# Patient Record
Sex: Male | Born: 1980 | Race: White | Hispanic: No | Marital: Married | Smoking: Never smoker
Health system: Southern US, Community
[De-identification: ages and names within clinical notes are randomized; demographics above are authoritative.]

---

## 2012-03-13 ENCOUNTER — Encounter: Payer: Self-pay | Admitting: Internal Medicine

## 2012-03-13 ENCOUNTER — Ambulatory Visit (INDEPENDENT_AMBULATORY_CARE_PROVIDER_SITE_OTHER): Admitting: Internal Medicine

## 2012-03-13 VITALS — BP 122/90 | HR 64 | Ht 74.0 in | Wt 213.8 lb

## 2012-03-13 DIAGNOSIS — J302 Other seasonal allergic rhinitis: Secondary | ICD-10-CM

## 2012-03-13 DIAGNOSIS — G4733 Obstructive sleep apnea (adult) (pediatric): Secondary | ICD-10-CM

## 2012-03-13 DIAGNOSIS — J309 Allergic rhinitis, unspecified: Secondary | ICD-10-CM

## 2012-03-13 NOTE — Progress Notes (Signed)
03/13/12- 31 yoM never smoker self referral-snoring and day time sleepiness; has woken up gasping for air at times. Wife complains of his snoring, without noticing apnea.  Bedtime 11 PM to midnight very short sleep latency. Wakes 2-3 times during the night before up at 5:30. He admits this is not enough sleep. Snoring is worse supine but he gets backache if he sleeps on his stomach. Failed nasal strips. He gained weight 10 years ago and his wife tells him that is when snoring began. Past history of seasonal allergic rhinitis which has been less important in recent years. No history of thyroid problems. ENT surgery limited to tonsils.  Prior to Admission medications   Not on File   History reviewed. No pertinent past medical history. History reviewed. No pertinent past surgical history.. Family History  Problem Relation Age of Onset  . Breast cancer Maternal Grandmother    History   Social History  . Marital Status: Married    Spouse Name: N/A    Number of Children: N/A  . Years of Education: N/A   Occupational History  . Healthcare Admin    Social History Main Topics  . Smoking status: Never Smoker   . Smokeless tobacco: Not on file  . Alcohol Use: Yes     2-3 weekly  . Drug Use: No  . Sexually Active: Not on file   Other Topics Concern  . Not on file   Social History Narrative  . No narrative on file   ROS-see HPI Constitutional:   No-   weight loss, night sweats, fevers, chills, +fatigue, lassitude. HEENT:   No-  headaches, difficulty swallowing, tooth/dental problems, sore throat,       + Some  sneezing, itching, ear ache, nasal congestion, post nasal drip,  CV:  No-   chest pain, orthopnea, PND, swelling in lower extremities, anasarca, dizziness, palpitations Resp: No-   shortness of breath with exertion or at rest.              No-   productive cough,  No non-productive cough,  No- coughing up of blood.              No-   change in color of mucus.  No- wheezing.     Skin: No-   rash or lesions. GI:  No-   heartburn, indigestion, abdominal pain, nausea, vomiting, diarrhea,                 change in bowel habits, loss of appetite GU: No-   dysuria, change in color of urine, no urgency or frequency.  No- flank pain. MS:  No-   joint pain or swelling.  No- decreased range of motion.  No- back pain. Neuro-     nothing unusual Psych:  No- change in mood or affect. No depression or anxiety.  No memory loss.  OBJ- Physical Exam General- Alert, Oriented, Affect-appropriate, Distress- none acute, tall, mildly overweight Skin- rash-none, lesions- none, excoriation- none Lymphadenopathy- none Head- atraumatic            Eyes- Gross vision intact, PERRLA, conjunctivae and secretions clear            Ears- Hearing, canals-normal            Nose- turbinate edema, no-Septal dev, mucus, polyps, erosion, perforation             Throat- Mallampati II , mucosa clear , drainage- none, tonsils- atrophic Neck- flexible , trachea midline, no stridor ,  thyroid nl, carotid no bruit Chest - symmetrical excursion , unlabored           Heart/CV- RRR , no murmur , no gallop  , no rub, nl s1 s2                           - JVD- none , edema- none, stasis changes- none, varices- none           Lung- clear to P&A, wheeze- none, cough- none , dullness-none, rub- none           Chest wall-  Abd- tender-no, distended-no, bowel sounds-present, HSM- no Br/ Gen/ Rectal- Not done, not indicated Extrem- cyanosis- none, clubbing, none, atrophy- none, strength- nl Neuro- grossly intact to observation

## 2012-03-13 NOTE — Patient Instructions (Addendum)
Order- [facility]  [facility] Unattended Home NPSG    Dx  OSA  Sample Nasonex nasal spray- 2 puffs each nostril once daily at bedtime.

## 2012-03-17 DIAGNOSIS — G4733 Obstructive sleep apnea (adult) (pediatric): Secondary | ICD-10-CM | POA: Insufficient documentation

## 2012-03-17 DIAGNOSIS — J302 Other seasonal allergic rhinitis: Secondary | ICD-10-CM | POA: Insufficient documentation

## 2012-03-17 NOTE — Assessment & Plan Note (Signed)
He qualifies for home sleep study so we will schedule that. We have discussed sleep hygiene, medical significance of sleep apnea if present, and the nature of a home, unattended sleep study

## 2012-03-17 NOTE — Assessment & Plan Note (Signed)
Still important enough to affect his sleep some in the springtime. Plan-sample of Nasonex for trial

## 2012-07-11 ENCOUNTER — Ambulatory Visit (INDEPENDENT_AMBULATORY_CARE_PROVIDER_SITE_OTHER): Admitting: Sports Medicine

## 2012-07-11 ENCOUNTER — Encounter: Payer: Self-pay | Admitting: Sports Medicine

## 2012-07-11 VITALS — BP 119/80 | HR 75 | Wt 197.0 lb

## 2012-07-11 DIAGNOSIS — J309 Allergic rhinitis, unspecified: Secondary | ICD-10-CM

## 2012-07-11 DIAGNOSIS — Z299 Encounter for prophylactic measures, unspecified: Secondary | ICD-10-CM

## 2012-07-11 DIAGNOSIS — R7402 Elevation of levels of lactic acid dehydrogenase (LDH): Secondary | ICD-10-CM

## 2012-07-11 DIAGNOSIS — J302 Other seasonal allergic rhinitis: Secondary | ICD-10-CM

## 2012-07-11 DIAGNOSIS — Z0000 Encounter for general adult medical examination without abnormal findings: Secondary | ICD-10-CM | POA: Insufficient documentation

## 2012-07-11 DIAGNOSIS — R7401 Elevation of levels of liver transaminase levels: Secondary | ICD-10-CM

## 2012-07-11 NOTE — Assessment & Plan Note (Addendum)
This is a healthy young male without any medical problems. Screening lipids, and a metabolic panel. He is up-to-date on all vaccinations, we'll be getting his flu shot next week. He can come back to see me in one year or sooner if needed.  Patient was counseled, and risk factors discussed.

## 2012-07-11 NOTE — Assessment & Plan Note (Signed)
Well-controlled with an occasional Claritin.

## 2012-07-11 NOTE — Progress Notes (Signed)
Subjective:    CC: Establish care.   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 He has no medical problems, and no complaints.    Allergic rhinitis: He does have occasional seasonal allergic rhinitis, as well controlled with occasional Claritin.   Snoring: He tends to snore at night, and has discussed this with the pulmonologist, sleep apnea is not suspected..  Past medical history, Surgical history, Family history, Social history, Allergies, and medications have been entered into the medical record, reviewed, and no changes needed.   Review of Systems: No headache, visual changes, nausea, vomiting, diarrhea, constipation, dizziness, abdominal pain, skin rash, fevers, chills, night sweats, swollen lymph nodes, weight loss, chest pain, body aches, joint swelling, muscle aches, or shortness of breath.   Objective:    General: Well Developed, well nourished, and in no acute distress.  Neuro: Alert and oriented x3, extra-ocular muscles intact.  HEENT: Normocephalic, atraumatic, pupils equal round reactive to light, neck supple, no masses, no lymphadenopathy, thyroid nonpalpable.  Skin: Warm and dry, no rashes noted.  Cardiac: Regular rate and rhythm, no murmurs rubs or gallops.  Respiratory: Clear to auscultation bilaterally. Not using accessory muscles, speaking in full sentences.  Abdominal: Soft, nontender, nondistended, positive bowel sounds, no masses, no organomegaly.  Musculoskeletal: Shoulder, elbow, wrist, hip, knee, ankle stable, and with full range of motion.   Impression and Recommendations:

## 2012-07-21 LAB — COMPREHENSIVE METABOLIC PANEL
AST: 43 U/L — ABNORMAL HIGH (ref 0–37)
Albumin: 4.5 g/dL (ref 3.5–5.2)
Alkaline Phosphatase: 52 U/L (ref 39–117)
BUN: 16 mg/dL (ref 6–23)
Potassium: 4.5 mEq/L (ref 3.5–5.3)
Sodium: 144 mEq/L (ref 135–145)
Total Bilirubin: 0.6 mg/dL (ref 0.3–1.2)

## 2012-07-21 LAB — LIPID PANEL
Cholesterol: 149 mg/dL (ref 0–200)
HDL: 37 mg/dL — ABNORMAL LOW (ref >39)
LDL Cholesterol: 101 mg/dL — ABNORMAL HIGH (ref 0–99)
Total CHOL/HDL Ratio: 4 Ratio
Triglycerides: 55 mg/dL (ref <150)
VLDL: 11 mg/dL (ref 0–40)

## 2012-07-21 LAB — COMPREHENSIVE METABOLIC PANEL WITH GFR
ALT: 38 U/L (ref 0–53)
CO2: 27 meq/L (ref 19–32)
Calcium: 9.5 mg/dL (ref 8.4–10.5)
Chloride: 106 meq/L (ref 96–112)
Creat: 0.98 mg/dL (ref 0.50–1.35)
Glucose, Bld: 76 mg/dL (ref 70–99)
Total Protein: 6.9 g/dL (ref 6.0–8.3)

## 2012-07-22 NOTE — Addendum Note (Signed)
Addended by: Monica Becton on: 07/22/2012 09:55 AM   Modules accepted: Orders

## 2012-07-23 ENCOUNTER — Other Ambulatory Visit: Payer: Self-pay | Admitting: *Deleted

## 2012-08-03 LAB — HEPATITIS PANEL, ACUTE
HCV Ab: NEGATIVE
Hep A IgM: NEGATIVE
Hep B C IgM: NEGATIVE
Hepatitis B Surface Ag: NEGATIVE

## 2014-04-21 ENCOUNTER — Encounter: Payer: Self-pay | Admitting: Sports Medicine

## 2014-04-21 ENCOUNTER — Ambulatory Visit (INDEPENDENT_AMBULATORY_CARE_PROVIDER_SITE_OTHER): Admitting: Sports Medicine

## 2014-04-21 VITALS — BP 124/81 | HR 65 | Ht 74.0 in | Wt 200.0 lb

## 2014-04-21 DIAGNOSIS — Z299 Encounter for prophylactic measures, unspecified: Secondary | ICD-10-CM

## 2014-04-21 DIAGNOSIS — Z0000 Encounter for general adult medical examination without abnormal findings: Secondary | ICD-10-CM

## 2014-04-21 LAB — CBC
HCT: 44.1 % (ref 39.0–52.0)
Hemoglobin: 15.6 g/dL (ref 13.0–17.0)
MCH: 29.9 pg (ref 26.0–34.0)
MCHC: 35.4 g/dL (ref 30.0–36.0)
MCV: 84.6 fL (ref 78.0–100.0)
Platelets: 254 10*3/uL (ref 150–400)
RBC: 5.21 MIL/uL (ref 4.22–5.81)
RDW: 14 % (ref 11.5–15.5)
WBC: 6.8 10*3/uL (ref 4.0–10.5)

## 2014-04-21 LAB — LIPID PANEL
Cholesterol: 157 mg/dL (ref 0–200)
HDL: 50 mg/dL (ref >39)
LDL Cholesterol: 94 mg/dL (ref 0–99)
Total CHOL/HDL Ratio: 3.1 ratio
Triglycerides: 63 mg/dL (ref <150)
VLDL: 13 mg/dL (ref 0–40)

## 2014-04-21 LAB — COMPREHENSIVE METABOLIC PANEL WITH GFR
AST: 17 U/L (ref 0–37)
Alkaline Phosphatase: 53 U/L (ref 39–117)
BUN: 13 mg/dL (ref 6–23)
Creat: 0.99 mg/dL (ref 0.50–1.35)

## 2014-04-21 LAB — COMPREHENSIVE METABOLIC PANEL
ALT: 27 U/L (ref 0–53)
Albumin: 4.7 g/dL (ref 3.5–5.2)
CO2: 28 mEq/L (ref 19–32)
Calcium: 9.7 mg/dL (ref 8.4–10.5)
Chloride: 103 mEq/L (ref 96–112)
Glucose, Bld: 86 mg/dL (ref 70–99)
Potassium: 4.8 mEq/L (ref 3.5–5.3)
Sodium: 140 mEq/L (ref 135–145)
Total Bilirubin: 0.8 mg/dL (ref 0.2–1.2)
Total Protein: 7.1 g/dL (ref 6.0–8.3)

## 2014-04-21 NOTE — Progress Notes (Signed)
  Subjective:    CC: Establish care.   HPI:  Preventive measure: This pleasant 33 year old male is here for a complete physical. No questions, no complaints.  Past medical history, Surgical history, Family history not pertinant except as noted below, Social history, Allergies, and medications have been entered into the medical record, reviewed, and no changes needed.   Review of Systems: No headache, visual changes, nausea, vomiting, diarrhea, constipation, dizziness, abdominal pain, skin rash, fevers, chills, night sweats, swollen lymph nodes, weight loss, chest pain, body aches, joint swelling, muscle aches, shortness of breath, mood changes, visual or auditory hallucinations.  Objective:    General: Well Developed, well nourished, and in no acute distress.  Neuro: Alert and oriented x3, extra-ocular muscles intact, sensation grossly intact. Cranial nerves II through XII are intact, motor, sensory, and coordinative functions are all intact. HEENT: Normocephalic, atraumatic, pupils equal round reactive to light, neck supple, no masses, no lymphadenopathy, thyroid nonpalpable. Oropharynx, nasopharynx, external ear canals are unremarkable. Skin: Warm and dry, no rashes noted.  Cardiac: Regular rate and rhythm, no murmurs rubs or gallops.  Respiratory: Clear to auscultation bilaterally. Not using accessory muscles, speaking in full sentences.  Abdominal: Soft, nontender, nondistended, positive bowel sounds, no masses, no organomegaly.  Musculoskeletal: Shoulder, elbow, wrist, hip, knee, ankle stable, and with full range of motion.  Impression and Recommendations:    The patient was counselled, risk factors were discussed, anticipatory guidance given.

## 2014-04-21 NOTE — Assessment & Plan Note (Signed)
Complete physical as above. Checking routine blood work. Return in one year.

## 2014-04-22 LAB — HEMOGLOBIN A1C
Hgb A1c MFr Bld: 5.4 % (ref <5.7)
Mean Plasma Glucose: 108 mg/dL (ref <117)

## 2014-04-22 LAB — TSH: TSH: 1.769 u[IU]/mL (ref 0.350–4.500)

## 2014-09-23 ENCOUNTER — Ambulatory Visit: Payer: Self-pay | Admitting: Sports Medicine

## 2014-09-23 DIAGNOSIS — Z0289 Encounter for other administrative examinations: Secondary | ICD-10-CM

## 2015-08-10 ENCOUNTER — Emergency Department (INDEPENDENT_AMBULATORY_CARE_PROVIDER_SITE_OTHER)
Admission: EM | Admit: 2015-08-10 | Discharge: 2015-08-10 | Disposition: A | Discharge status: Home / Self Care | Source: Home / Self Care | Attending: Family Medicine | Admitting: Family Medicine

## 2015-08-10 ENCOUNTER — Encounter: Payer: Self-pay | Admitting: *Deleted

## 2015-08-10 ENCOUNTER — Emergency Department (INDEPENDENT_AMBULATORY_CARE_PROVIDER_SITE_OTHER)

## 2015-08-10 DIAGNOSIS — R05 Cough: Secondary | ICD-10-CM | POA: Diagnosis not present

## 2015-08-10 DIAGNOSIS — R053 Chronic cough: Secondary | ICD-10-CM

## 2015-08-10 MED ORDER — AZITHROMYCIN 250 MG PO TABS
ORAL_TABLET | ORAL | Status: DC
Start: 2015-08-10 — End: 2016-02-16

## 2015-08-10 MED ORDER — PREDNISONE 20 MG PO TABS: 20.0000 mg | ORAL_TABLET | Freq: Two times a day (BID) | ORAL | Status: DC

## 2015-08-10 MED ORDER — BENZONATATE 200 MG PO CAPS: 200.0000 mg | ORAL_CAPSULE | Freq: Every day | ORAL | Status: DC

## 2015-08-10 NOTE — ED Notes (Signed)
Pt c/o 3 weeks of productive cough, and nasal congestion. Fever the first week, afebrile for 2 weeks. Denies pain or SOB.

## 2015-08-10 NOTE — ED Provider Notes (Signed)
CSN: [id number]     Arrival date & time 08/10/15  1100 History   First MD Initiated Contact with Patient 08/10/15 1136     Chief Complaint  Patient presents with  . Cough  . Nasal Congestion      HPI Comments: Patient complains of onset of a non-productive cough about 3 weeks ago that has persisted.  He has had some nasal congestion but no significant sore throat.  He had low grade fever during the first week that has resolved.  No pleuritic pain or shortness of breath, but he does wheeze occasionally. His Tdap is current.  The history is provided by the patient.    History reviewed. No pertinent past medical history. History reviewed. No pertinent past surgical history. Family History  Problem Relation Age of Onset  . Breast cancer Maternal Grandmother   . Cancer Maternal Grandmother   . Diabetes Maternal Grandmother   . Hypertension Maternal Grandmother   . Diabetes Mother   . Hypertension Mother   . Alcohol abuse Father   . Hyperlipidemia Father   . Hypertension Father   . Diabetes Maternal Aunt   . Alcohol abuse Maternal Grandfather   . Hypertension Maternal Grandfather   . Hyperlipidemia Paternal Grandmother   . Hypertension Paternal Grandmother   . Stroke Paternal Grandmother   . Alcohol abuse Paternal Grandfather   . Heart disease Paternal Grandfather   . Hyperlipidemia Paternal Grandfather   . Hypertension Paternal Grandfather    Social History  Substance Use Topics  . Smoking status: Never Smoker   . Smokeless tobacco: None  . Alcohol Use: Yes     Comment: 2-3 weekly    Review of Systems + sore throat, resolved + cough No pleuritic pain + wheezing No nasal congestion No post-nasal drainage No sinus pain/pressure No itchy/red eyes No earache No hemoptysis No SOB No fever/chills No nausea No vomiting No abdominal pain No diarrhea No urinary symptoms No skin rash No fatigue No myalgias No headache Used OTC meds without relief  Allergies   Review of patient's allergies indicates no known allergies.  Home Medications   Prior to Admission medications   Medication Sig Start Date End Date Taking? Authorizing Provider  azithromycin (ZITHROMAX Z-PAK) 250 MG tablet Take 2 tabs today; then begin one tab once daily for 4 more days. 08/10/15   Kandra Nicolas, MD  benzonatate (TESSALON) 200 MG capsule Take 1 capsule (200 mg total) by mouth at bedtime. Take as needed for cough 08/10/15   Kandra Nicolas, MD  predniSONE (DELTASONE) 20 MG tablet Take 1 tablet (20 mg total) by mouth 2 (two) times daily. Take with food. 08/10/15   Kandra Nicolas, MD   Meds Ordered and Administered this Visit  Medications - No data to display  BP 133/86 mmHg  Pulse 64  Temp(Src) 98.1 F (36.7 C) (Oral)  Resp 16  Wt 210 lb (95.255 kg)  SpO2 97% No data found.   Physical Exam Nursing notes and Vital Signs reviewed. Appearance:  Patient appears stated age, and in no acute distress Eyes:  Pupils are equal, round, and reactive to light and accomodation.  Extraocular movement is intact.  Conjunctivae are not inflamed  Ears:  Canals normal.  Tympanic membranes normal.  Nose:  Mildly congested turbinates.  No sinus tenderness.   Pharynx:  Normal Neck:  Supple.  Scattered tenderness of posterior nodes are palpated bilaterally  Lungs:   Right posterior chest has rhonchi and few scattered faint wheezes.  Breath sounds are equal.  Moving air well. Heart:  Regular rate and rhythm without murmurs, rubs, or gallops.  Abdomen:  Nontender without masses or hepatosplenomegaly.  Bowel sounds are present.  No CVA or flank tenderness.  Extremities:  No edema.  No calf tenderness Skin:  No rash present.   ED Course  Procedures  None   Imaging Review Dg Chest 2 View  08/10/2015  CLINICAL DATA:  Persisting cough for 3 weeks EXAM: CHEST - 2 VIEW COMPARISON:  None. FINDINGS: The heart size and mediastinal contours are within normal limits. Both lungs are clear.  The visualized skeletal structures are unremarkable. IMPRESSION: No active disease. Electronically Signed   By: Inez Catalina M.D.   On: 08/10/2015 12:21       MDM   1. Persistent cough; ?mycoplasma    Begin Z-pak and prednisone burst. Prescription written for Benzonatate (Tessalon) to take at bedtime for night-time cough.   Take plain guaifenesin (1200mg  extended release tabs such as Mucinex) twice daily, with plenty of water, for cough and congestion.    Stop all antihistamines for now, and other non-prescription cough/cold preparations.   Follow-up with family doctor if not improving about 7 to10 days.     Kandra Nicolas, MD 08/10/15 [id number]

## 2015-08-10 NOTE — Discharge Instructions (Signed)
Take plain guaifenesin (1200mg  extended release tabs such as Mucinex) twice daily, with plenty of water, for cough and congestion.    Stop all antihistamines for now, and other non-prescription cough/cold preparations.   Follow-up with family doctor if not improving about 7 to10 days.

## 2016-02-16 ENCOUNTER — Ambulatory Visit (INDEPENDENT_AMBULATORY_CARE_PROVIDER_SITE_OTHER): Admitting: Sports Medicine

## 2016-02-16 DIAGNOSIS — G4733 Obstructive sleep apnea (adult) (pediatric): Secondary | ICD-10-CM | POA: Diagnosis not present

## 2016-02-16 DIAGNOSIS — Z299 Encounter for prophylactic measures, unspecified: Secondary | ICD-10-CM

## 2016-02-16 LAB — CBC
HCT: 44.7 % (ref 38.5–50.0)
Hemoglobin: 15.5 g/dL (ref 13.2–17.1)
MCH: 30.1 pg (ref 27.0–33.0)
MCHC: 34.7 g/dL (ref 32.0–36.0)
MCV: 86.8 fL (ref 80.0–100.0)
MPV: 10.5 fL (ref 7.5–12.5)
Platelets: 225 10*3/uL (ref 140–400)
RBC: 5.15 MIL/uL (ref 4.20–5.80)
RDW: 13.3 % (ref 11.0–15.0)
WBC: 5.4 10*3/uL (ref 3.8–10.8)

## 2016-02-16 LAB — COMPREHENSIVE METABOLIC PANEL WITH GFR
Albumin: 4.5 g/dL (ref 3.6–5.1)
CO2: 29 mmol/L (ref 20–31)
Chloride: 104 mmol/L (ref 98–110)
Creat: 0.97 mg/dL (ref 0.60–1.35)
Potassium: 4.3 mmol/L (ref 3.5–5.3)
Sodium: 141 mmol/L (ref 135–146)

## 2016-02-16 LAB — HEMOGLOBIN A1C
Hgb A1c MFr Bld: 5.6 % (ref <5.7)
Mean Plasma Glucose: 114 mg/dL

## 2016-02-16 LAB — TESTOSTERONE: Testosterone: 211 ng/dL — ABNORMAL LOW (ref 250–827)

## 2016-02-16 LAB — COMPREHENSIVE METABOLIC PANEL
ALT: 27 U/L (ref 9–46)
AST: 20 U/L (ref 10–40)
Alkaline Phosphatase: 51 U/L (ref 40–115)
BUN: 13 mg/dL (ref 7–25)
Calcium: 9.3 mg/dL (ref 8.6–10.3)
Glucose, Bld: 81 mg/dL (ref 65–99)
Total Bilirubin: 0.8 mg/dL (ref 0.2–1.2)
Total Protein: 7 g/dL (ref 6.1–8.1)

## 2016-02-16 LAB — LIPID PANEL
Cholesterol: 160 mg/dL (ref 125–200)
HDL: 43 mg/dL (ref >=40)
LDL Cholesterol: 107 mg/dL (ref <130)
Total CHOL/HDL Ratio: 3.7 ratio (ref <=5.0)
Triglycerides: 48 mg/dL (ref <150)
VLDL: 10 mg/dL (ref <30)

## 2016-02-16 LAB — TSH: TSH: 1.4 m[IU]/L (ref 0.40–4.50)

## 2016-02-16 NOTE — Assessment & Plan Note (Signed)
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

## 2016-02-16 NOTE — Assessment & Plan Note (Signed)
Complete physical as above. Checking routine blood work.

## 2016-02-16 NOTE — Progress Notes (Signed)
  Subjective:    CC: Complete physical exam   HPI:  This is a pleasant 35 year old male, he has no complaints with the exception of the time sleepiness. 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 Desires a full sleep study.  Past medical history, Surgical history, Family history not pertinant except as noted below, Social history, Allergies, and medications have been entered into the medical record, reviewed, and no changes needed.   Review of Systems: No headache, visual changes, nausea, vomiting, diarrhea, constipation, dizziness, abdominal pain, skin rash, fevers, chills, night sweats, swollen lymph nodes, weight loss, chest pain, body aches, joint swelling, muscle aches, shortness of breath, mood changes, visual or auditory hallucinations.  Objective:    General: Well Developed, well nourished, and in no acute distress.  Neuro: Alert and oriented x3, extra-ocular muscles intact, sensation grossly intact. Cranial nerves II through XII are intact, motor, sensory, and coordinative functions are all intact. HEENT: Normocephalic, atraumatic, pupils equal round reactive to light, neck supple, no masses, no lymphadenopathy, thyroid nonpalpable. Oropharynx, nasopharynx, external ear canals are unremarkable. Skin: Warm and dry, no rashes noted.  Cardiac: Regular rate and rhythm, no murmurs rubs or gallops.  Respiratory: Clear to auscultation bilaterally. Not using accessory muscles, speaking in full sentences.  Abdominal: Soft, nontender, nondistended, positive bowel sounds, no masses, no organomegaly.  Musculoskeletal: Shoulder, elbow, wrist, hip, knee, ankle stable, and with full range of motion.  Impression and Recommendations:    The patient was counselled, risk factors were discussed, anticipatory guidance given.

## 2016-02-17 ENCOUNTER — Other Ambulatory Visit: Payer: Self-pay

## 2016-02-17 LAB — VITAMIN D 25 HYDROXY (VIT D DEFICIENCY, FRACTURES): Vit D, 25-Hydroxy: 25 ng/mL — ABNORMAL LOW (ref 30–100)

## 2016-02-17 MED ORDER — VITAMIN D (ERGOCALCIFEROL) 1.25 MG (50000 UNIT) PO CAPS: 50000.0000 [IU] | ORAL_CAPSULE | ORAL | Status: DC

## 2016-02-17 NOTE — Addendum Note (Signed)
Addended by: Silverio Decamp on: 02/17/2016 08:18 AM   Modules accepted: Orders

## 2016-02-25 ENCOUNTER — Ambulatory Visit (HOSPITAL_BASED_OUTPATIENT_CLINIC_OR_DEPARTMENT_OTHER): Attending: Sports Medicine | Admitting: Internal Medicine

## 2016-02-25 VITALS — Ht 74.0 in | Wt 205.0 lb

## 2016-02-25 DIAGNOSIS — R5383 Other fatigue: Secondary | ICD-10-CM | POA: Insufficient documentation

## 2016-02-25 DIAGNOSIS — R0683 Snoring: Secondary | ICD-10-CM | POA: Insufficient documentation

## 2016-02-25 DIAGNOSIS — R29818 Other symptoms and signs involving the nervous system: Secondary | ICD-10-CM

## 2016-02-25 DIAGNOSIS — G4719 Other hypersomnia: Secondary | ICD-10-CM | POA: Insufficient documentation

## 2016-02-25 DIAGNOSIS — G4761 Periodic limb movement disorder: Secondary | ICD-10-CM | POA: Insufficient documentation

## 2016-02-25 DIAGNOSIS — G4730 Sleep apnea, unspecified: Secondary | ICD-10-CM | POA: Insufficient documentation

## 2016-03-05 NOTE — Procedures (Signed)
   Patient Name: Tyrone Simpson, Tyrone Simpson Date: 02/25/2016 Gender: Male D.O.B: [birth year] Age (years): 35 Referring Provider: Gwen Her Thekkekandam Height (inches): 74 Interpreting Physician: Baird Lyons MD, ABSM Weight (lbs): 205 RPSGT: Laren Everts BMI: 26 MRN: [id number] Neck Size: 16.50 CLINICAL INFORMATION Sleep Study Type: NPSG Indication for sleep study: Excessive Daytime Sleepiness, Fatigue, OSA, Snoring Epworth Sleepiness Score: 12  SLEEP STUDY TECHNIQUE As per the AASM Manual for the Scoring of Sleep and Associated Events v2.3 (April 2016) with a hypopnea requiring 4% desaturations. The channels recorded and monitored were frontal, central and occipital EEG, electrooculogram (EOG), submentalis EMG (chin), nasal and oral airflow, thoracic and abdominal wall motion, anterior tibialis EMG, snore microphone, electrocardiogram, and pulse oximetry.  MEDICATIONS Patient's medications include: charted for review Medications self-administered by patient during sleep study : No sleep medicine administered.  SLEEP ARCHITECTURE The study was initiated at 10:02:05 PM and ended at 5:02:28 AM. Sleep onset time was 14.8 minutes and the sleep efficiency was 81.6%. The total sleep time was 343.1 minutes. Stage REM latency was 171.5 minutes. The patient spent 5.68% of the night in stage N1 sleep, 73.33% in stage N2 sleep, 0.00% in stage N3 and 20.98% in REM. Alpha intrusion was absent. Supine sleep was 16.47%. Wake after sleep onset 62 minutes  RESPIRATORY PARAMETERS The overall apnea/hypopnea index (AHI) was 0.0 per hour. There were 0 total apneas, including 0 obstructive, 0 central and 0 mixed apneas. There were 0 hypopneas and 14 RERAs. The AHI during Stage REM sleep was 0.0 per hour. AHI while supine was 0.0 per hour. The mean oxygen saturation was 95.06%. The minimum SpO2 during sleep was 91.00%. Moderate snoring was noted during this study.  CARDIAC DATA The 2 lead EKG  demonstrated sinus rhythm. The mean heart rate was 49.57 beats per minute. Other EKG findings include: None.  LEG MOVEMENT DATA The total PLMS were 159 with a resulting PLMS index of 27.80. Associated arousal with leg movement index was 2.4 .  IMPRESSIONS - No significant obstructive sleep apnea occurred during this study (AHI = 0.0/h). - No significant central sleep apnea occurred during this study (CAI = 0.0/h). - The patient had minimal or no oxygen desaturation during the study (Min O2 = 91.00%) - The patient snored with Moderate snoring volume. - No cardiac abnormalities were noted during this study. - Mild periodic limb movements of sleep occurred during the study. Few associated arousals.  DIAGNOSIS - Periodic Limb Movement Syndrome (327.51 [G47.61 ICD-10]) - Primary Snoring (786.09 [R06.83 ICD-10]) - Normal study  RECOMMENDATIONS - Avoid alcohol, sedatives and other CNS depressants that may worsen sleep apnea and disrupt normal sleep architecture. - Sleep hygiene should be reviewed to assess factors that may improve sleep quality. - Weight management and regular exercise should be initiated or continued if appropriate.  Deneise Lever Diplomate, American Board of Sleep Medicine  ELECTRONICALLY SIGNED ON:  03/05/2016, 11:07 AM [place] PH: [phone]   FX: [phone] [place]

## 2017-06-09 ENCOUNTER — Encounter: Payer: Self-pay | Admitting: Sports Medicine

## 2017-07-01 IMAGING — CR DG CHEST 2V
2 series · 2 of 2 positions shown · non-contrast
Comparison: None.

CLINICAL DATA: Persisting cough for 3 weeks

EXAM:
CHEST - 2 VIEW

[chest pa]
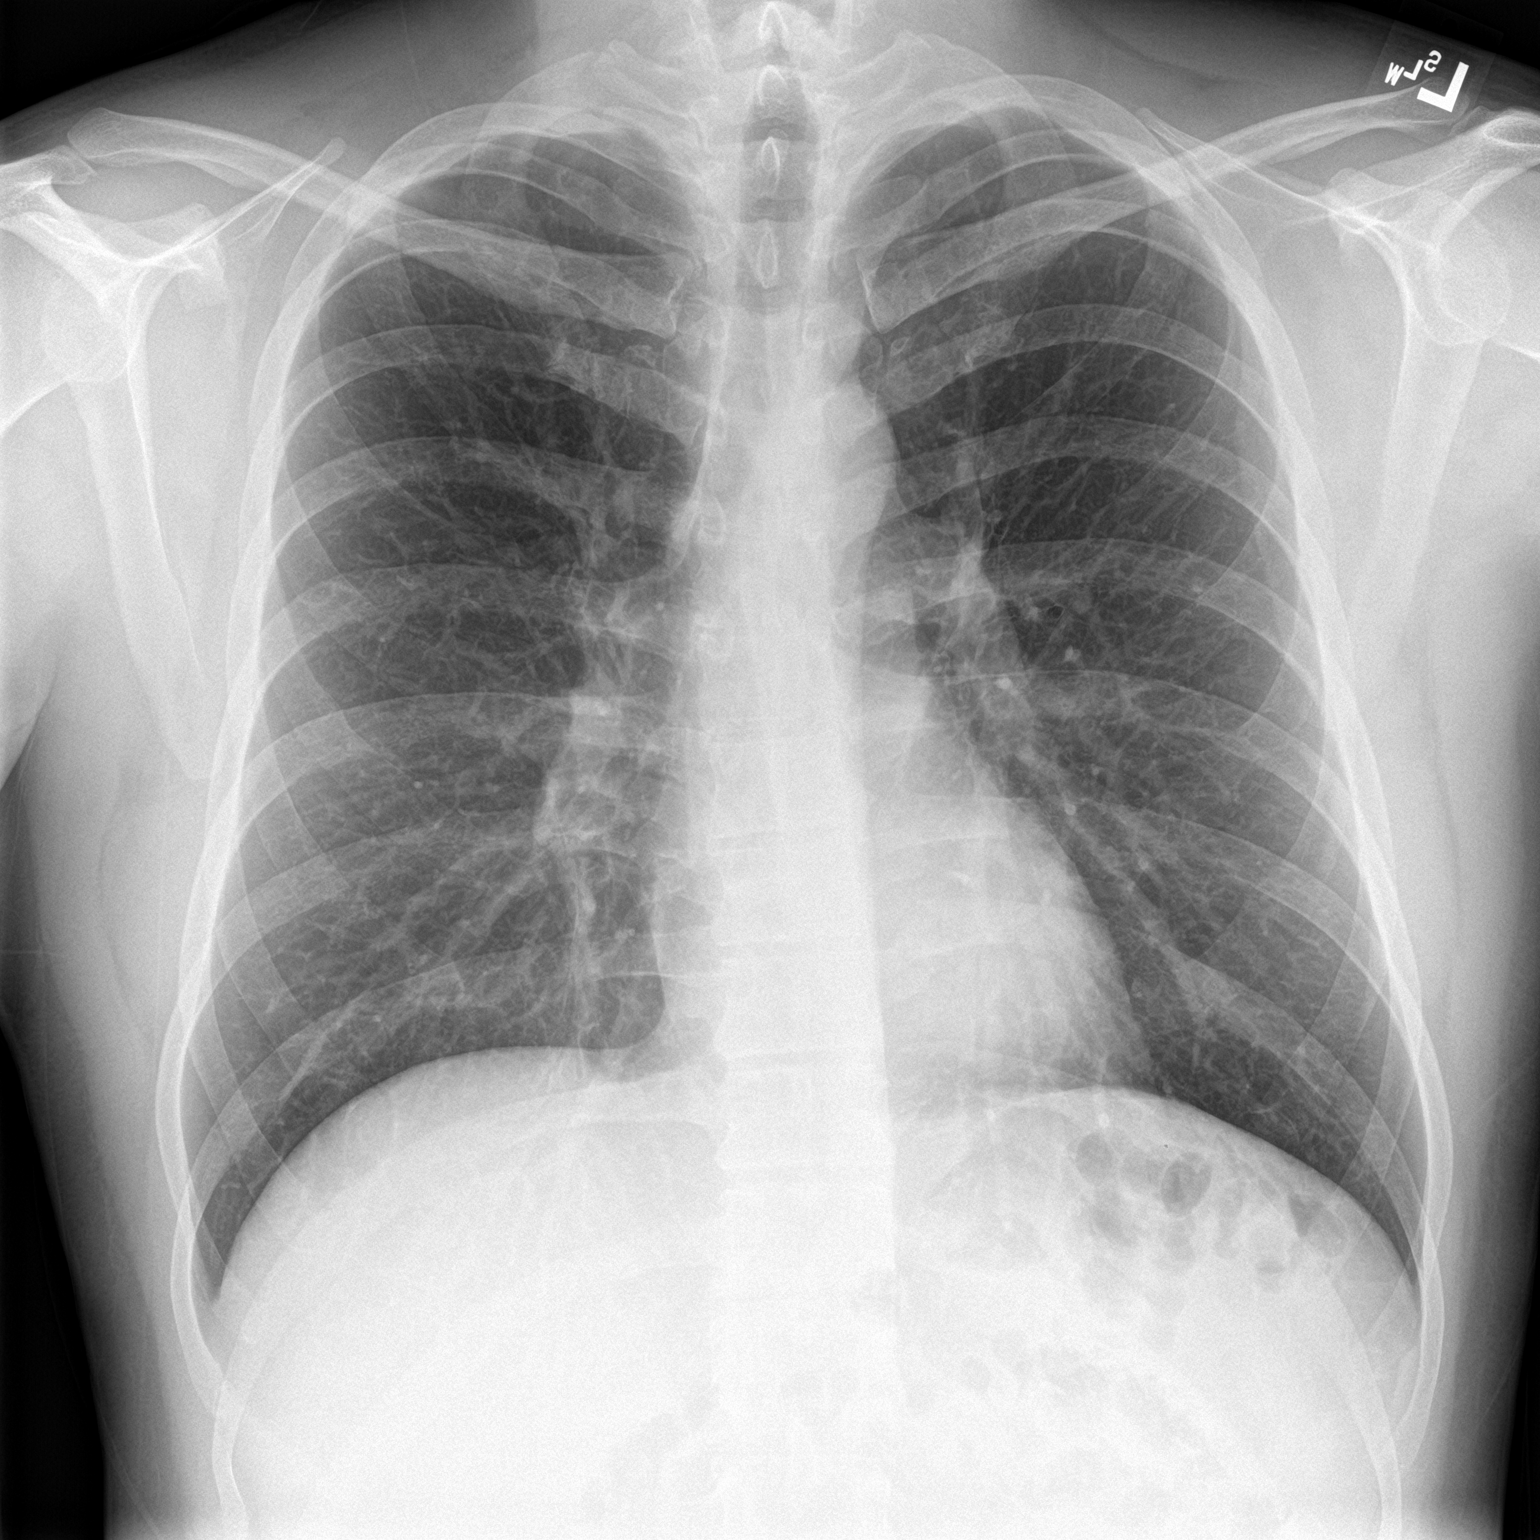

[chest lat]
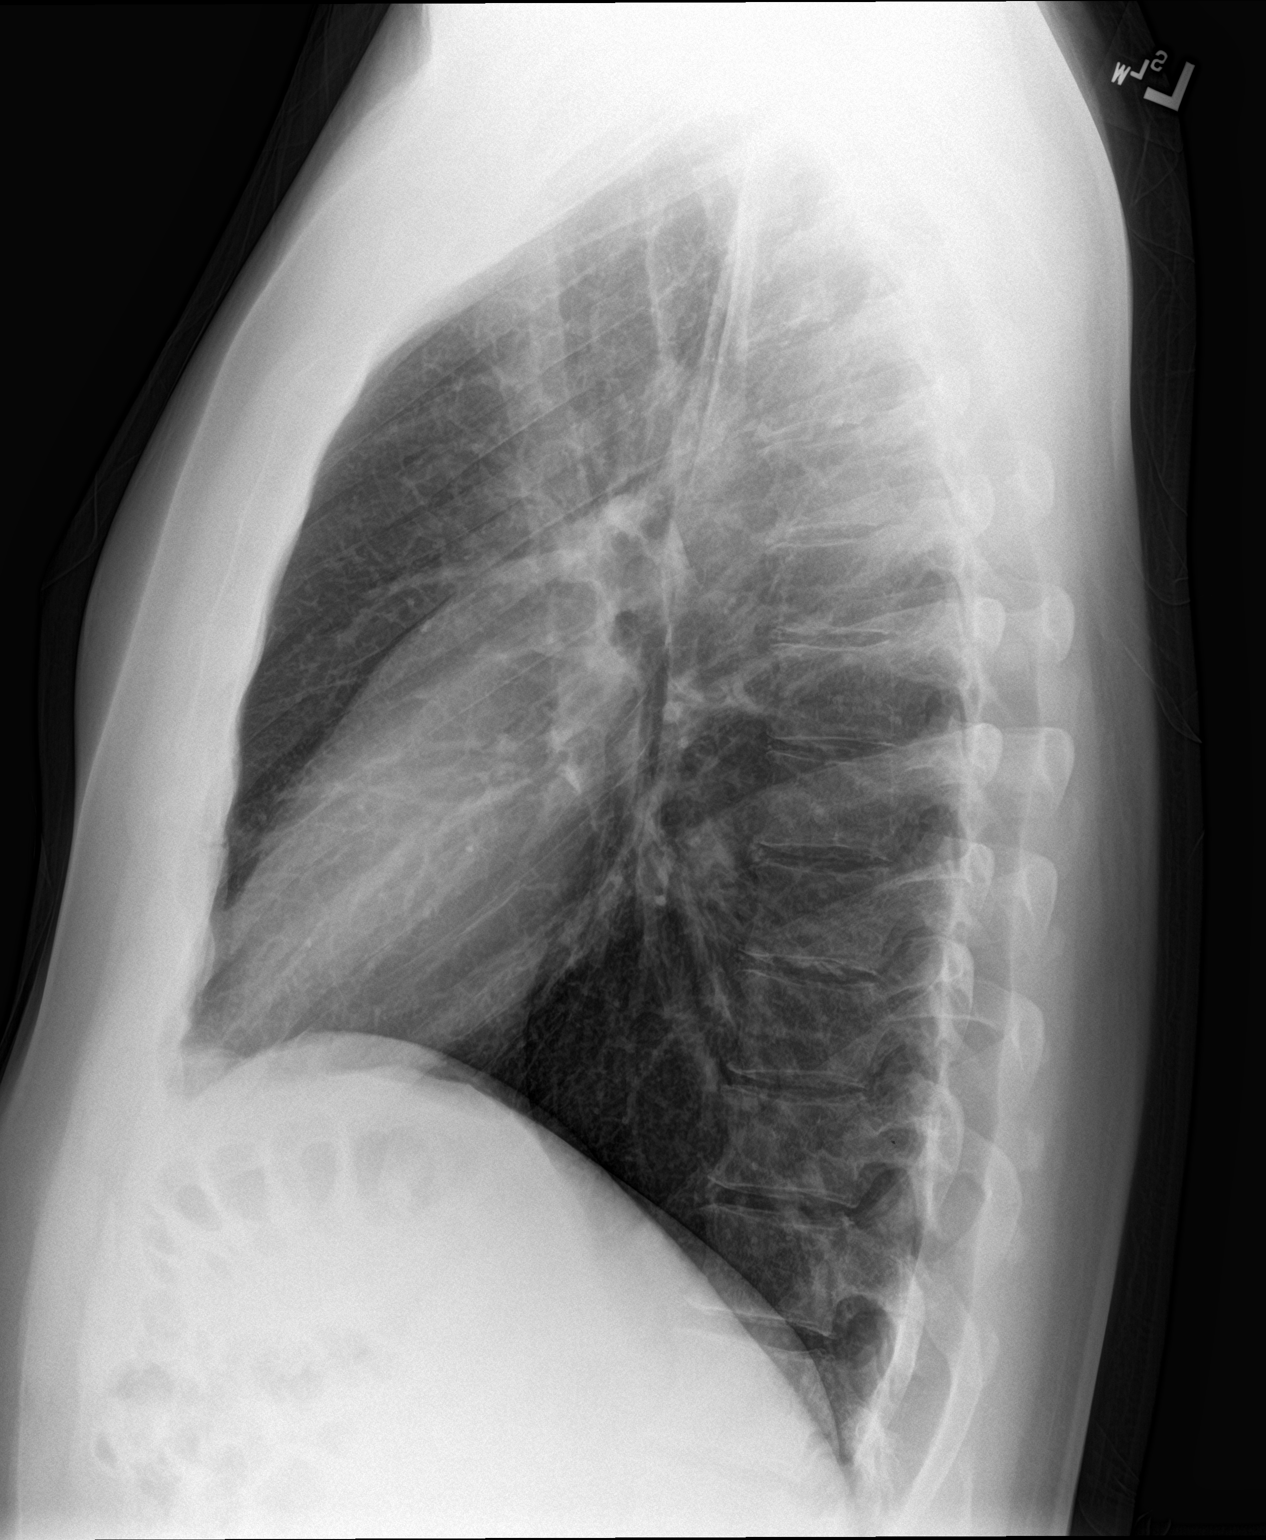

[2 of 2 positions shown; findings below may reference images not displayed]

FINDINGS: The heart size and mediastinal contours are within normal limits.
Both lungs are clear. The visualized skeletal structures are
unremarkable.
IMPRESSION: No active disease.

## 2017-08-17 ENCOUNTER — Encounter: Payer: Self-pay | Admitting: Sports Medicine

## 2017-08-17 ENCOUNTER — Ambulatory Visit (INDEPENDENT_AMBULATORY_CARE_PROVIDER_SITE_OTHER): Admitting: Sports Medicine

## 2017-08-17 DIAGNOSIS — Z0000 Encounter for general adult medical examination without abnormal findings: Secondary | ICD-10-CM | POA: Diagnosis not present

## 2017-08-17 NOTE — Assessment & Plan Note (Signed)
Annual physical as above, routine blood work, return in 1 year.

## 2017-08-17 NOTE — Progress Notes (Signed)
  Subjective:    CC: Annual physical exam  HPI:  Jr is here for his physical, no complaints  Past medical history:  Negative.  See flowsheet/record as well for more information.  Surgical history: Negative.  See flowsheet/record as well for more information.  Family history: Negative.  See flowsheet/record as well for more information.  Social history: Negative.  See flowsheet/record as well for more information.  Allergies, and medications have been entered into the medical record, reviewed, and no changes needed.    Review of Systems: No headache, visual changes, nausea, vomiting, diarrhea, constipation, dizziness, abdominal pain, skin rash, fevers, chills, night sweats, swollen lymph nodes, weight loss, chest pain, body aches, joint swelling, muscle aches, shortness of breath, mood changes, visual or auditory hallucinations.  Objective:    General: Well Developed, well nourished, and in no acute distress.  Neuro: Alert and oriented x3, extra-ocular muscles intact, sensation grossly intact. Cranial nerves II through XII are intact, motor, sensory, and coordinative functions are all intact. HEENT: Normocephalic, atraumatic, pupils equal round reactive to light, neck supple, no masses, no lymphadenopathy, thyroid nonpalpable. Oropharynx, nasopharynx, external ear canals are unremarkable. Skin: Warm and dry, no rashes noted.  Cardiac: Regular rate and rhythm, no murmurs rubs or gallops.  Respiratory: Clear to auscultation bilaterally. Not using accessory muscles, speaking in full sentences.  Abdominal: Soft, nontender, nondistended, positive bowel sounds, no masses, no organomegaly.  Musculoskeletal: Shoulder, elbow, wrist, hip, knee, ankle stable, and with full range of motion.  Impression and Recommendations:    The patient was counselled, risk factors were discussed, anticipatory guidance given.  Annual physical exam Annual physical as above, routine blood work, return in 1  year.  ___________________________________________ Gwen Her. Dianah Field, M.D., ABFM., CAQSM. Primary Care and [place] Instructor of [employer]

## 2017-08-18 ENCOUNTER — Other Ambulatory Visit: Payer: Self-pay | Admitting: Sports Medicine

## 2017-08-18 LAB — LIPID PANEL W/REFLEX DIRECT LDL
Cholesterol: 192 mg/dL (ref <200)
HDL: 43 mg/dL (ref >40)
LDL Cholesterol (Calc): 130 mg/dL (calc) — ABNORMAL HIGH
Non-HDL Cholesterol (Calc): 149 mg/dL — ABNORMAL HIGH (ref <130)
Total CHOL/HDL Ratio: 4.5 (calc) (ref <5.0)
Triglycerides: 86 mg/dL (ref <150)

## 2017-08-18 LAB — CBC
HCT: 46.7 % (ref 38.5–50.0)
Hemoglobin: 16.2 g/dL (ref 13.2–17.1)
MCH: 30.2 pg (ref 27.0–33.0)
MCHC: 34.7 g/dL (ref 32.0–36.0)
MCV: 87.1 fL (ref 80.0–100.0)
MPV: 11.1 fL (ref 7.5–12.5)
Platelets: 231 10*3/uL (ref 140–400)
RBC: 5.36 Million/uL (ref 4.20–5.80)
RDW: 12.7 % (ref 11.0–15.0)
WBC: 6.8 Thousand/uL (ref 3.8–10.8)

## 2017-08-18 LAB — COMPREHENSIVE METABOLIC PANEL
AST: 19 U/L (ref 10–40)
Albumin: 4.7 g/dL (ref 3.6–5.1)
Alkaline phosphatase (APISO): 57 U/L (ref 40–115)
BUN: 12 mg/dL (ref 7–25)
CO2: 29 mmol/L (ref 20–32)
Chloride: 103 mmol/L (ref 98–110)
Creat: 1.05 mg/dL (ref 0.60–1.35)
Globulin: 2.5 g/dL (calc) (ref 1.9–3.7)
Potassium: 4.4 mmol/L (ref 3.5–5.3)
Total Bilirubin: 0.8 mg/dL (ref 0.2–1.2)

## 2017-08-18 LAB — COMPREHENSIVE METABOLIC PANEL WITH GFR
AG Ratio: 1.9 (calc) (ref 1.0–2.5)
ALT: 31 U/L (ref 9–46)
Calcium: 9.8 mg/dL (ref 8.6–10.3)
Glucose, Bld: 89 mg/dL (ref 65–99)
Sodium: 139 mmol/L (ref 135–146)
Total Protein: 7.2 g/dL (ref 6.1–8.1)

## 2017-08-18 LAB — HEMOGLOBIN A1C
Hgb A1c MFr Bld: 5.3 % of total Hgb (ref <5.7)
Mean Plasma Glucose: 105 (calc)
eAG (mmol/L): 5.8 (calc)

## 2017-08-18 LAB — TSH: TSH: 2.83 mIU/L (ref 0.40–4.50)

## 2017-08-18 LAB — VITAMIN D 25 HYDROXY (VIT D DEFICIENCY, FRACTURES): Vit D, 25-Hydroxy: 24 ng/mL — ABNORMAL LOW (ref 30–100)

## 2017-08-18 LAB — HIV ANTIBODY (ROUTINE TESTING W REFLEX): HIV 1&2 Ab, 4th Generation: NONREACTIVE

## 2017-08-18 MED ORDER — VITAMIN D (ERGOCALCIFEROL) 1.25 MG (50000 UNIT) PO CAPS: 50000.0000 [IU] | ORAL_CAPSULE | ORAL | 0 refills | Status: DC

## 2017-09-22 DIAGNOSIS — E559 Vitamin D deficiency, unspecified: Secondary | ICD-10-CM | POA: Insufficient documentation

## 2018-08-31 ENCOUNTER — Ambulatory Visit (INDEPENDENT_AMBULATORY_CARE_PROVIDER_SITE_OTHER): Admitting: Sports Medicine

## 2018-08-31 ENCOUNTER — Encounter: Payer: Self-pay | Admitting: Sports Medicine

## 2018-08-31 VITALS — BP 109/73 | HR 71 | Ht 74.0 in | Wt 208.0 lb

## 2018-08-31 DIAGNOSIS — Z125 Encounter for screening for malignant neoplasm of prostate: Secondary | ICD-10-CM | POA: Diagnosis not present

## 2018-08-31 DIAGNOSIS — D229 Melanocytic nevi, unspecified: Secondary | ICD-10-CM | POA: Diagnosis not present

## 2018-08-31 DIAGNOSIS — N139 Obstructive and reflux uropathy, unspecified: Secondary | ICD-10-CM | POA: Diagnosis not present

## 2018-08-31 DIAGNOSIS — Z0000 Encounter for general adult medical examination without abnormal findings: Secondary | ICD-10-CM | POA: Diagnosis not present

## 2018-08-31 DIAGNOSIS — L918 Other hypertrophic disorders of the skin: Secondary | ICD-10-CM | POA: Diagnosis not present

## 2018-08-31 DIAGNOSIS — E785 Hyperlipidemia, unspecified: Secondary | ICD-10-CM

## 2018-08-31 NOTE — Assessment & Plan Note (Signed)
Likely BPH, very mild. Does not desire treatment at this time. Checking PSA.

## 2018-08-31 NOTE — Assessment & Plan Note (Signed)
Annual physical as above, return in 1 year.

## 2018-08-31 NOTE — Progress Notes (Signed)
Subjective:    CC: Annual physical exam  HPI:  Tyrone Simpson returns, he is a pleasant 37 year old male.  Overall doing well, has a couple of suspicious skin lesions, also poor urine stream.  No nocturia, minimal dribbling, no urgency, frequency, burning.  No constitutional symptoms.  I reviewed the past medical history, family history, social history, surgical history, and allergies today and no changes were needed.  Please see the problem list section below in epic for further details.  Past Medical History: No past medical history on file. Past Surgical History: No past surgical history on file. Social History: Social History   Socioeconomic History  . Marital status: Married    Spouse name: Not on file  . Number of children: Not on file  . Years of education: Not on file  . Highest education level: Not on file  Occupational History  . Occupation: Sales executive  Social Needs  . Financial resource strain: Not on file  . Food insecurity:    Worry: Not on file    Inability: Not on file  . Transportation needs:    Medical: Not on file    Non-medical: Not on file  Tobacco Use  . Smoking status: Never Smoker  . Smokeless tobacco: Never Used  Substance and Sexual Activity  . Alcohol use: Yes    Comment: 2-3 weekly  . Drug use: No  . Sexual activity: Yes  Lifestyle  . Physical activity:    Days per week: Not on file    Minutes per session: Not on file  . Stress: Not on file  Relationships  . Social connections:    Talks on phone: Not on file    Gets together: Not on file    Attends religious service: Not on file    Active member of club or organization: Not on file    Attends meetings of clubs or organizations: Not on file    Relationship status: Not on file  Other Topics Concern  . Not on file  Social History Narrative  . Not on file   Family History: Family History  Problem Relation Age of Onset  . Breast cancer Maternal Grandmother   . Cancer Maternal  Grandmother   . Diabetes Maternal Grandmother   . Hypertension Maternal Grandmother   . Diabetes Mother   . Hypertension Mother   . Alcohol abuse Father   . Hyperlipidemia Father   . Hypertension Father   . Diabetes Maternal Aunt   . Alcohol abuse Maternal Grandfather   . Hypertension Maternal Grandfather   . Hyperlipidemia Paternal Grandmother   . Hypertension Paternal Grandmother   . Stroke Paternal Grandmother   . Alcohol abuse Paternal Grandfather   . Heart disease Paternal Grandfather   . Hyperlipidemia Paternal Grandfather   . Hypertension Paternal Grandfather    Allergies: No Known Allergies Medications: See med rec.  Review of Systems: No headache, visual changes, nausea, vomiting, diarrhea, constipation, dizziness, abdominal pain, skin rash, fevers, chills, night sweats, swollen lymph nodes, weight loss, chest pain, body aches, joint swelling, muscle aches, shortness of breath, mood changes, visual or auditory hallucinations.  Objective:    General: Well Developed, well nourished, and in no acute distress.  Neuro: Alert and oriented x3, extra-ocular muscles intact, sensation grossly intact. Cranial nerves II through XII are intact, motor, sensory, and coordinative functions are all intact. HEENT: Normocephalic, atraumatic, pupils equal round reactive to light, neck supple, no masses, no lymphadenopathy, thyroid nonpalpable. Oropharynx, nasopharynx, external ear canals are unremarkable. Skin:  Warm and dry, no rashes noted.  There is a 1 cm variegated hyperpigmented nevus on his left posterior shoulder.  There is also a skin tag on the right neck Cardiac: Regular rate and rhythm, no murmurs rubs or gallops.  Respiratory: Clear to auscultation bilaterally. Not using accessory muscles, speaking in full sentences.  Abdominal: Soft, nontender, nondistended, positive bowel sounds, no masses, no organomegaly.  Musculoskeletal: Shoulder, elbow, wrist, hip, knee, ankle stable, and  with full range of motion.  Procedure: Punch biopsy left shoulder suspicious nevus Risks, benefits, and alternatives explained and consent obtained. Time out conducted. Surface prepped with alcohol. 1cc lidocaine with epinephine infiltrated in a field block. Adequate anesthesia ensured. Area prepped and draped in a sterile fashion. Excision performed with: 6 mm punch biopsy used to remove the lesion down into the subcutaneous tissues, I then used a single 3-0 simple interrupted Ethilon suture to close the incision Hemostasis achieved. Pt stable.  Procedure:  Excision of right-sided neck skin tag Risks, benefits, and alternatives explained and consent obtained. Time out conducted. Surface prepped with alcohol. 1cc lidocaine with epinephine infiltrated in a field block. Adequate anesthesia ensured. Area prepped and draped in a sterile fashion. Excision performed with: I simply clipped the skin tag with a pair of scissors and placed a Band-Aid. Hemostasis achieved. Pt stable.  Impression and Recommendations:    The patient was counselled, risk factors were discussed, anticipatory guidance given.  Annual physical exam Annual physical as above, return in 1 year.  Obstructive uropathy Likely BPH, very mild. Does not desire treatment at this time. Checking PSA.  Nevus Punch biopsy as above. He will get 1 of his nurse family members to remove his stitch in about a week and a half.  Skin tag Excised today. ___________________________________________ Gwen Her. Dianah Field, M.D., ABFM., CAQSM. Primary Care and Sports Medicine [facility]  Adjunct Professor of [place] [employer] [facility] [employer]

## 2018-08-31 NOTE — Assessment & Plan Note (Signed)
Punch biopsy as above. He will get 1 of his nurse family members to remove his stitch in about a week and a half.

## 2018-08-31 NOTE — Assessment & Plan Note (Signed)
Excised today.

## 2018-09-03 LAB — CBC
HCT: 48.1 % (ref 38.5–50.0)
Hemoglobin: 16.7 g/dL (ref 13.2–17.1)
MCH: 30.3 pg (ref 27.0–33.0)
MCHC: 34.7 g/dL (ref 32.0–36.0)
MCV: 87.3 fL (ref 80.0–100.0)
MPV: 11.2 fL (ref 7.5–12.5)
Platelets: 268 10*3/uL (ref 140–400)
RBC: 5.51 10*6/uL (ref 4.20–5.80)
RDW: 12.5 % (ref 11.0–15.0)
WBC: 6.5 10*3/uL (ref 3.8–10.8)

## 2018-09-03 LAB — LIPID PANEL W/REFLEX DIRECT LDL
Cholesterol: 181 mg/dL (ref <200)
HDL: 45 mg/dL (ref >40)
LDL Cholesterol (Calc): 120 mg/dL (calc) — ABNORMAL HIGH
Non-HDL Cholesterol (Calc): 136 mg/dL (calc) — ABNORMAL HIGH (ref <130)
Total CHOL/HDL Ratio: 4 (calc) (ref <5.0)
Triglycerides: 65 mg/dL (ref <150)

## 2018-09-03 LAB — COMPREHENSIVE METABOLIC PANEL WITH GFR
AG Ratio: 1.8 (calc) (ref 1.0–2.5)
ALT: 26 U/L (ref 9–46)
AST: 20 U/L (ref 10–40)
Calcium: 9.6 mg/dL (ref 8.6–10.3)
Total Bilirubin: 0.6 mg/dL (ref 0.2–1.2)

## 2018-09-03 LAB — COMPREHENSIVE METABOLIC PANEL
Albumin: 4.7 g/dL (ref 3.6–5.1)
Alkaline phosphatase (APISO): 52 U/L (ref 40–115)
BUN: 11 mg/dL (ref 7–25)
CO2: 29 mmol/L (ref 20–32)
Chloride: 104 mmol/L (ref 98–110)
Creat: 1.08 mg/dL (ref 0.60–1.35)
Globulin: 2.6 g/dL (calc) (ref 1.9–3.7)
Glucose, Bld: 94 mg/dL (ref 65–99)
Potassium: 4.5 mmol/L (ref 3.5–5.3)
Sodium: 141 mmol/L (ref 135–146)
Total Protein: 7.3 g/dL (ref 6.1–8.1)

## 2018-09-03 LAB — TSH: TSH: 2.09 m[IU]/L (ref 0.40–4.50)

## 2018-09-03 LAB — PSA, TOTAL AND FREE
PSA, % Free: 38 % (calc) (ref >25)
PSA, Free: 0.3 ng/mL
PSA, Total: 0.8 ng/mL (ref <=4.0)

## 2019-09-18 ENCOUNTER — Telehealth (INDEPENDENT_AMBULATORY_CARE_PROVIDER_SITE_OTHER): Admitting: Sports Medicine

## 2019-09-18 DIAGNOSIS — M7662 Achilles tendinitis, left leg: Secondary | ICD-10-CM | POA: Diagnosis not present

## 2019-09-18 MED ORDER — NITROGLYCERIN 0.2 MG/HR TD PT24: MEDICATED_PATCH | TRANSDERMAL | 11 refills | Status: DC

## 2019-09-18 NOTE — Progress Notes (Signed)
Virtual Visit via WebEx/MyChart   I connected with  Tyrone Simpson  on 09/18/19 via WebEx/MyChart/Doximity Video and verified that I am speaking with the correct person using two identifiers.   I discussed the limitations, risks, security and privacy concerns of performing an evaluation and management service by WebEx/MyChart/Doximity Video, including the higher likelihood of inaccurate diagnosis and treatment, and the availability of in person appointments.  We also discussed the likely need of an additional face to face encounter for complete and high quality delivery of care.  I also discussed with the patient that there may be a patient responsible charge related to this service. The patient expressed understanding and wishes to proceed.  Provider location is either at home or medical facility. Patient location is at their home, different from provider location. People involved in care of the patient during this telehealth encounter were myself, my nurse/medical assistant, and my front office/scheduling team member.  Subjective:    CC: Ankle pain  HPI: For some time Tyrone Simpson has noted pain in the back of his left ankle, proximal to the calcaneus, mid Achilles.  Worse with running, he has been trying to get in shape lately, he gained about 20 pounds through the pandemic so far.  I reviewed the past medical history, family history, social history, surgical history, and allergies today and no changes were needed.  Please see the problem list section below in epic for further details.  Past Medical History: No past medical history on file. Past Surgical History: No past surgical history on file. Social History: Social History   Socioeconomic History  . Marital status: Married    Spouse name: Not on file  . Number of children: Not on file  . Years of education: Not on file  . Highest education level: Not on file  Occupational History  . Occupation: Healthcare Admin  Tobacco Use  .  Smoking status: Never Smoker  . Smokeless tobacco: Never Used  Substance and Sexual Activity  . Alcohol use: Yes    Comment: 2-3 weekly  . Drug use: No  . Sexual activity: Yes  Other Topics Concern  . Not on file  Social History Narrative  . Not on file   Social Determinants of Health   Financial Resource Strain:   . Difficulty of Paying Living Expenses: Not on file  Food Insecurity:   . Worried About Charity fundraiser in the Last Year: Not on file  . Ran Out of Food in the Last Year: Not on file  Transportation Needs:   . Lack of Transportation (Medical): Not on file  . Lack of Transportation (Non-Medical): Not on file  Physical Activity:   . Days of Exercise per Week: Not on file  . Minutes of Exercise per Session: Not on file  Stress:   . Feeling of Stress : Not on file  Social Connections:   . Frequency of Communication with Friends and Family: Not on file  . Frequency of Social Gatherings with Friends and Family: Not on file  . Attends Religious Services: Not on file  . Active Member of Clubs or Organizations: Not on file  . Attends Archivist Meetings: Not on file  . Marital Status: Not on file   Family History: Family History  Problem Relation Age of Onset  . Breast cancer Maternal Grandmother   . Cancer Maternal Grandmother   . Diabetes Maternal Grandmother   . Hypertension Maternal Grandmother   . Diabetes Mother   .  Hypertension Mother   . Alcohol abuse Father   . Hyperlipidemia Father   . Hypertension Father   . Diabetes Maternal Aunt   . Alcohol abuse Maternal Grandfather   . Hypertension Maternal Grandfather   . Hyperlipidemia Paternal Grandmother   . Hypertension Paternal Grandmother   . Stroke Paternal Grandmother   . Alcohol abuse Paternal Grandfather   . Heart disease Paternal Grandfather   . Hyperlipidemia Paternal Grandfather   . Hypertension Paternal Grandfather    Allergies: No Known Allergies Medications: See med  rec.  Review of Systems: No fevers, chills, night sweats, weight loss, chest pain, or shortness of breath.   Objective:    General: Speaking full sentences, no audible heavy breathing.  Sounds alert and appropriately interactive.  Appears well.  Face symmetric.  Extraocular movements intact.  Pupils equal and round.  No nasal flaring or accessory muscle use visualized.  No other physical exam performed due to the non-physical nature of this visit.  Impression and Recommendations:    Achilles tendinitis, left leg Unable to do exam, visit was virtual, adding topical nitroglycerin, heel lifts will be left at the front for him to pick up. Adding formal physical therapy for eccentric rehabilitation, return to see me in 1 month. At that point we will probably work on getting him custom molded orthotics and help him with weight loss as well.  I discussed the above assessment and treatment plan with the patient. The patient was provided an opportunity to ask questions and all were answered. The patient agreed with the plan and demonstrated an understanding of the instructions.   The patient was advised to call back or seek an in-person evaluation if the symptoms worsen or if the condition fails to improve as anticipated.   I provided 25 minutes of non-face-to-face time during this encounter, 15 minutes of additional time was needed to gather information, review chart, records, communicate/coordinate with staff remotely, troubleshooting the multiple errors that we get every time when trying to do video calls through the electronic medical record, WebEx, and Doximity, restart the encounter multiple times due to instability of the software, as well as complete documentation.   ___________________________________________ Gwen Her. Dianah Field, M.D., ABFM., CAQSM. Primary Care and Sports Medicine [facility]  Adjunct Professor of [place] of [facility] [employer]

## 2019-09-18 NOTE — Assessment & Plan Note (Signed)
Unable to do exam, visit was virtual, adding topical nitroglycerin, heel lifts will be left at the front for him to pick up. Adding formal physical therapy for eccentric rehabilitation, return to see me in 1 month. At that point we will probably work on getting him custom molded orthotics and help him with weight loss as well.

## 2019-10-08 ENCOUNTER — Other Ambulatory Visit: Payer: Self-pay

## 2019-10-08 ENCOUNTER — Ambulatory Visit (INDEPENDENT_AMBULATORY_CARE_PROVIDER_SITE_OTHER): Admitting: Rehabilitative and Restorative Service Providers"

## 2019-10-08 DIAGNOSIS — M25572 Pain in left ankle and joints of left foot: Secondary | ICD-10-CM

## 2019-10-08 NOTE — Therapy (Signed)
[place], [place], [address] Phone: [phone]   Fax:  [phone]  Physical Therapy Evaluation  Patient Details  Name: Tyrone Simpson MRN: [id number] Date of Birth: [birth year] Referring Provider (PT): Silverio Decamp, MD   Encounter Date: 10/08/2019  PT End of Session - 10/08/19 1314    Visit Number  1    Number of Visits  4    Date for PT Re-Evaluation  11/22/19    Authorization Type  tricare    PT Start Time  0845    PT Stop Time  0931    PT Time Calculation (min)  46 min    Activity Tolerance  Patient tolerated treatment well    Behavior During Therapy  [facility] for tasks assessed/performed       No past medical history on file.  No past surgical history on file.  There were no vitals filed for this visit.   Subjective Assessment - 10/08/19 0849    Subjective  The patient began with tightness and burning sensations in the left achilles in 11/2018.  The burning sensation got worse when walking on hardwoods and walking barefoot.  Over the summer, this got worse with pain while standing/walking for longer periods at work.  He got orthotic inserts for shoes.  Pain is worse after sitting and the first few steps leads to a burning sensation.  He notes a sensation of warmth in the left achilles tendon.  He is using heel raisers in his house shoes and outside work shoes. His pain has currently reduced.    Patient Stated Goals  manage the left ankle to avoid chronic pain;   be able to return to activity    Currently in Pain?  Yes    Pain Score  0-No pain    Pain Location  Ankle    Pain Orientation  Posterior;Left    Pain Descriptors / Indicators  Burning    Pain Type  Chronic pain    Pain Onset  More than a month ago    Pain Frequency  Intermittent    Aggravating Factors   barefoot walking    Pain Relieving Factors  wearing heel risers         [facility] PT Assessment - 10/08/19 0858       Assessment   Medical Diagnosis  M76.62 (ICD-10-CM) - Achilles tendinitis, left leg    Referring Provider (PT)  Silverio Decamp, MD    Onset Date/Surgical Date  09/18/19    Prior Therapy  none      Precautions   Precautions  None      Restrictions   Weight Bearing Restrictions  No      Balance Screen   Has the patient fallen in the past 6 months  No    Has the patient had a decrease in activity level because of a fear of falling?   No    Is the patient reluctant to leave their home because of a fear of falling?   No      Home Film/video editor residence      Prior Function   Level of Independence  Independent    Vocation  Full time employment    Vocation Requirements  mix of walking and computer work  (70% deskwork due to more [employer])    Leisure  Patient services in [employer] and has physical requirements of running 2 miles in  16 minutes that is a goal for May 2021.      Observation/Other Assessments   Focus on Therapeutic Outcomes (FOTO)   deferred due to anticipating need for HEP instruction only      Sensation   Light Touch  Appears Intact      Posture/Postural Control   Posture/Postural Control  Postural limitations      ROM / Strength   AROM / PROM / Strength  AROM;Strength      AROM   Overall AROM   Deficits    Overall AROM Comments  tightness bilaterally for heel cords    AROM Assessment Site  Ankle    Right/Left Ankle  Right;Left    Right Ankle Dorsiflexion  5    Left Ankle Dorsiflexion  5      Strength   Overall Strength  Within functional limits for tasks performed    Overall Strength Comments  The patient can perform 10 heel raises unilateral stance.  Overall strength 5/5 in bilateral hips, knees, ankles.      Palpation   Palpation comment  No point tenderness over achilles tendon L      Ambulation/Gait   Ambulation/Gait  Yes    Ambulation/Gait Assistance  7: Independent    Gait Comments  Gait mechanics  WNLs.                 Objective measurements completed on examination: See above findings.      [facility] Adult PT Treatment/Exercise - 10/08/19 0858      Exercises   Exercises  Knee/Hip;Ankle      Knee/Hip Exercises: Stretches   Active Hamstring Stretch Limitations  provided image for hamstring stretch due to tightness with single leg kicks      Ankle Exercises: Stretches   Soleus Stretch  2 reps;30 seconds    Gastroc Stretch  2 reps;30 seconds      Ankle Exercises: Standing   SLS  single limb stance provided HEP for single leg kicks to work towards for single leg ankle stability             PT Education - 10/08/19 0925    Education Details  HEP;  also provided resource for online yoga due to anticipated low # of visits with busy schedule and work Community education officer) Educated  Patient    Methods  Explanation;Demonstration;Handout    Comprehension  Returned demonstration;Verbalized understanding          PT Long Term Goals - 10/08/19 1314      PT LONG TERM GOAL #1   Title  The patient will be indep with HEP progression.    Time  4    Period  Weeks    Target Date  11/22/19      PT LONG TERM GOAL #2   Title  The patient will report return to physical activity without increasing burning sensation in L achilles.    Time  4    Period  Weeks    Target Date  11/22/19             Plan - 10/08/19 1315    Clinical Impression Statement  The patient is a 39 year old male presenting to outpatient physical therapy with h/o L achilles tendonitis reporting intermittent burning after sitting.  At today's eval, he presents with heel cord tightness and decreased ankle stability in unilateral stance.  He has responded well to heel risers and currently is not experiencing burning sensation or  pain in achilles.  PT instructed patient in HEP for heel cord stretch, ankle stability.  Plan to f/u one visit via telehealth to progress to more strengthening and ankle  stabiltiy as needed.    Stability/Clinical Decision Making  Stable/Uncomplicated    Clinical Decision Making  Low    Rehab Potential  Good    PT Frequency  1x / week    PT Duration  4 weeks    PT Treatment/Interventions  ADLs/Self Care Home Management;Dry needling;Taping;Manual techniques;Iontophoresis 4mg /ml Dexamethasone;Cryotherapy;Gait training;Functional mobility training;Therapeutic activities;Therapeutic exercise    PT Next Visit Plan  progress HEP; check on progress with running;    Consulted and Agree with Plan of Care  Patient       Patient will benefit from skilled therapeutic intervention in order to improve the following deficits and impairments:  Decreased range of motion, Impaired flexibility  Visit Diagnosis: Pain in left ankle and joints of left foot     Problem List Patient Active Problem List   Diagnosis Date Noted  . Achilles tendinitis, left leg 09/18/2019  . Obstructive uropathy 08/31/2018  . Skin tag 08/31/2018  . Nevus 08/31/2018  . Vitamin D deficiency 09/22/2017  . Annual physical exam 07/11/2012  . Obstructive sleep apnea suspected 03/17/2012  . Seasonal allergic rhinitis 03/17/2012    Deneene Tarver, PT 10/08/2019, 1:18 PM  [facility] [place], [place], [address] Phone: [phone]   Fax:  [phone]  Name: Tyrone Simpson MRN: [id number] Date of Birth: 07/21/1981

## 2019-10-08 NOTE — Patient Instructions (Signed)
Access Code: [email or web address]  URL: [email or web address]  Date: 10/08/2019  Prepared by: Rudell Cobb   Exercises Gastroc Stretch on Step - 3 reps - 1 sets - 30 seconds hold - 2x daily - 7x weekly Standing Soleus Stretch - 3 reps - 1 sets - 30 seconds hold - 2x daily - 7x weekly Single Leg Stance - 3 reps - 1 sets - 30 seconds hold - 2x daily - 7x weekly Leg Swing Single Leg Balance - 10 reps - 1 sets - 2x daily - 7x weekly Seated Hamstring Stretch with Chair - 3 reps - 1 sets - 30 seconds hold - 2x daily - 7x weekly

## 2019-10-25 ENCOUNTER — Ambulatory Visit (INDEPENDENT_AMBULATORY_CARE_PROVIDER_SITE_OTHER): Admitting: Rehabilitative and Restorative Service Providers"

## 2019-10-25 ENCOUNTER — Encounter: Payer: Self-pay | Admitting: Rehabilitative and Restorative Service Providers"

## 2019-10-25 DIAGNOSIS — M25572 Pain in left ankle and joints of left foot: Secondary | ICD-10-CM | POA: Diagnosis not present

## 2019-10-25 NOTE — Therapy (Addendum)
[facility] [place] [address] [place], [place], [address] Phone: [phone]   Fax:  [phone]  Physical Therapy Treatment and Discharge Summary  Patient Details  Name: Tyrone Simpson MRN: [id number] Date of Birth: [birth year] Referring Provider (PT): Silverio Decamp, MD  Physical Therapy Telehealth Visit:  I connected with Lynnwood Beckford today by secure, live face-to-face video conference and verified that I am speaking with the correct person using two identifiers. I discussed the limitations, risks, security and privacy concerns of performing a video visit. I also discussed with the patient or legal guardian that there may be a patient responsible charge related to this service. The patient or legal guardian expressed understanding and verbal consent was obtained by patient.  The patient's address was confirmed.  Identified to the patient that therapist is a licensed [place] in the state of [place].    Encounter Date: 10/25/2019  PT End of Session - 10/25/19 1012    Visit Number  2    Number of Visits  4    Date for PT Re-Evaluation  11/22/19    Authorization Type  tricare    PT Start Time  0925    PT Stop Time  0955    PT Time Calculation (min)  30 min    Activity Tolerance  --    Behavior During Therapy  [facility] for tasks assessed/performed       History reviewed. No pertinent past medical history.  History reviewed. No pertinent surgical history.  There were no vitals filed for this visit.  Subjective Assessment - 10/25/19 1352    Subjective  The patient notes initial participation in HEP with some decrease in frequency recently.  He had a sendentary weekend last weekend and this led to increased irriation of the L achilles tendon on Monday noted as a burning sensation that improves with activity.   He was able to perform a slow run without pain this week.  He is using heel wedges in all shoes except running shoes.    Patient  Stated Goals  manage the left ankle to avoid chronic pain;   be able to return to activity    Currently in Pain?  No/denies                       [facility] Adult PT Treatment/Exercise - 10/25/19 1355      Self-Care   Self-Care  Other Self-Care Comments    Other Self-Care Comments   PT discussed:  1) HEEL WEDGES recommending use for 4-6 weeks to decrease inflammation when inflammed and then begin to gradually go back to no heel wedges on weekends and progress to no heel wedges during work week as able.  He feels most beneficial in house shoes and PT recommended continue as he notes walking barefoot in home is most aggravating condition.  2) ICE MASSAGE:  Discussed ice massage 6 minutes over achilles tendon when inflammed for short term treatment of inflammation.       Exercises   Exercises  Ankle;Other Exercises    Other Exercises   PT and patient discussed single limb stance and no difference noted between steady state standing and leg swings. Therefore, d/c leg swings.  Demonstrated single leg heel raise to perform R and L sides discussing technique during performance.  Demonstrated self mgmt of trigger points using tennis ball or foam roller.               PT  Education - 10/25/19 1012    Education Details  progression of HEP    Person(s) Educated  Patient    Methods  Explanation;Demonstration;Handout    Comprehension  Verbalized understanding          PT Long Term Goals - 10/25/19 1354      PT LONG TERM GOAL #1   Title  The patient will be indep with HEP progression.    Time  4    Period  Weeks    Status  On-going    Target Date  11/22/19      PT LONG TERM GOAL #2   Title  The patient will report return to physical activity without increasing burning sensation in L achilles.    Time  4    Period  Weeks    Status  On-going    Target Date  11/22/19            Plan - 10/25/19 1359    Clinical Impression Statement  The patient appears to have  intermittent flare up of L achilles tendon worse after being sedentary and when walking barefoot in his home.  PT progressed therapeutic exercise, and performed self care discussing ice massage, soft tissue mobilization, and use of heel wedges.  Patient to continue working on HEP and will f/u if future needs arise.    Stability/Clinical Decision Making  Stable/Uncomplicated    Rehab Potential  Good    PT Frequency  1x / week    PT Duration  4 weeks    PT Treatment/Interventions  ADLs/Self Care Home Management;Dry needling;Taping;Manual techniques;Iontophoresis 49m/ml Dexamethasone;Cryotherapy;Gait training;Functional mobility training;Therapeutic activities;Therapeutic exercise    PT Next Visit Plan  progress HEP; check on progress with running;    Consulted and Agree with Plan of Care  Patient       Patient will benefit from skilled therapeutic intervention in order to improve the following deficits and impairments:  Decreased range of motion, Impaired flexibility  Visit Diagnosis: Pain in left ankle and joints of left foot  PHYSICAL THERAPY DISCHARGE SUMMARY  Visits from Start of Care: 2  Current functional level related to goals / functional outcomes: Goals not formally reassessed.  Completed eval and then one telehealth treatment to ensure HEP progressing well.    Education / Equipment: HEP, return to physical activity/progression.  Plan: Patient agrees to discharge.  Patient goals were not met. Patient is being discharged due to financial reasons.  ?????        Problem List Patient Active Problem List   Diagnosis Date Noted  . Achilles tendinitis, left leg 09/18/2019  . Obstructive uropathy 08/31/2018  . Skin tag 08/31/2018  . Nevus 08/31/2018  . Vitamin D deficiency 09/22/2017  . Annual physical exam 07/11/2012  . Obstructive sleep apnea suspected 03/17/2012  . Seasonal allergic rhinitis 03/17/2012    Africa Masaki, PT 10/25/2019, 2:00 PM  [facility] [place] [address]Phone: [phone]  Fax:  [phone] Name: ERhydian BaldiMRN: [id number] of Birth: 4September 11, 1982

## 2019-10-25 NOTE — Patient Instructions (Signed)
Access Code: [email or web address]  URL: [email or web address]  Date: 10/25/2019  Prepared by: Rudell Cobb   Program Notes  Use heel wedge when heel cord gets inflammed to allow muscle to rest.  After 4-6 weeks, you want to slowly transition back to no use of heel wedges by removing them on weekends, then intermittently during the work day. Since you get the most relief of heel wedge in house shoes, continue with this ongoing.  For tight musculature in gastrocs, you can use a tennis ball and roll out the calf muscle.   Exercises Gastroc Stretch on Step - 3 reps - 1 sets - 30 seconds hold - 2x daily - 7x weekly Standing Soleus Stretch - 3 reps - 1 sets - 30 seconds hold - 2x daily - 7x weekly Single Leg Stance - 3 reps - 1 sets - 30 seconds hold - 2x daily - 7x weekly Standing Single Leg Heel Raise - 20 reps - 1 sets - 2x daily - 7x weekly Seated Hamstring Stretch with Chair - 3 reps - 1 sets - 30 seconds hold - 2x daily - 7x weekly

## 2020-12-01 ENCOUNTER — Ambulatory Visit (HOSPITAL_COMMUNITY)
Admission: RE | Admit: 2020-12-01 | Discharge: 2020-12-01 | Disposition: A | Discharge status: Home / Self Care | Source: Ambulatory Visit | Attending: Sports Medicine | Admitting: Sports Medicine

## 2020-12-01 ENCOUNTER — Telehealth (INDEPENDENT_AMBULATORY_CARE_PROVIDER_SITE_OTHER): Admitting: Sports Medicine

## 2020-12-01 ENCOUNTER — Other Ambulatory Visit: Payer: Self-pay

## 2020-12-01 DIAGNOSIS — S6991XA Unspecified injury of right wrist, hand and finger(s), initial encounter: Secondary | ICD-10-CM | POA: Insufficient documentation

## 2020-12-01 MED ORDER — MELOXICAM 15 MG PO TABS
ORAL_TABLET | ORAL | 3 refills | Status: DC
Start: 2020-12-01 — End: 2022-01-17

## 2020-12-01 NOTE — Assessment & Plan Note (Signed)
Tyrone Simpson is a pleasant 40 year old male, 3 weeks ago he was playing basketball with the kids, dunking on them, unfortunately he jammed his right thumb.  He had some pain and swelling without bruising, pain mostly over the radial aspect of the first metacarpophalangeal joint. Because he still having pain 3 weeks later we will get some x-rays, continue his thumb spica brace, adding meloxicam, if still with discomfort he will see me in 2 weeks for physical examination, I think he has a physical exam appointment coming up with me anyway on the 31st of this month so we can knock both out at the same time.

## 2020-12-01 NOTE — Progress Notes (Signed)
Playing basketball 3 weeks ago jammed right thumb, very limited movement. Ibuprofen as needed, mostly in the evenings.

## 2020-12-01 NOTE — Progress Notes (Signed)
   Virtual Visit via WebEx/MyChart   I connected with  Tyrone Simpson  on 12/01/20 via WebEx/MyChart/Doximity Video and verified that I am speaking with the correct person using two identifiers.   I discussed the limitations, risks, security and privacy concerns of performing an evaluation and management service by WebEx/MyChart/Doximity Video, including the higher likelihood of inaccurate diagnosis and treatment, and the availability of in person appointments.  We also discussed the likely need of an additional face to face encounter for complete and high quality delivery of care.  I also discussed with the patient that there may be a patient responsible charge related to this service. The patient expressed understanding and wishes to proceed.  Provider location is in medical facility. Patient location is at their home, different from provider location. People involved in care of the patient during this telehealth encounter were myself, my nurse/medical assistant, and my front office/scheduling team member.  Review of Systems: No fevers, chills, night sweats, weight loss, chest pain, or shortness of breath.   Objective Findings:    General: Speaking full sentences, no audible heavy breathing.  Sounds alert and appropriately interactive.  Appears well.  Face symmetric.  Extraocular movements intact.  Pupils equal and round.  No nasal flaring or accessory muscle use visualized.  Independent interpretation of tests performed by another provider:   None.  Brief History, Exam, Impression, and Recommendations:    Injury of right thumb Shanon Brow is a pleasant 40 year old male, 3 weeks ago he was playing basketball with the kids, dunking on them, unfortunately he jammed his right thumb.  He had some pain and swelling without bruising, pain mostly over the radial aspect of the first metacarpophalangeal joint. Because he still having pain 3 weeks later we will get some x-rays, continue his thumb spica  brace, adding meloxicam, if still with discomfort he will see me in 2 weeks for physical examination, I think he has a physical exam appointment coming up with me anyway on the 31st of this month so we can knock both out at the same time.   I discussed the above assessment and treatment plan with the patient. The patient was provided an opportunity to ask questions and all were answered. The patient agreed with the plan and demonstrated an understanding of the instructions.   The patient was advised to call back or seek an in-person evaluation if the symptoms worsen or if the condition fails to improve as anticipated.   I provided 30 minutes of face to face and non-face-to-face time during this encounter date, time was needed to gather information, review chart, records, communicate/coordinate with staff remotely, as well as complete documentation.   ___________________________________________ Gwen Her. Dianah Field, M.D., ABFM., CAQSM. Primary Care and [place] Instructor of [place] [employer] [facility] of Medicine

## 2020-12-17 ENCOUNTER — Ambulatory Visit (INDEPENDENT_AMBULATORY_CARE_PROVIDER_SITE_OTHER): Admitting: Sports Medicine

## 2020-12-17 ENCOUNTER — Encounter: Payer: Self-pay | Admitting: Sports Medicine

## 2020-12-17 ENCOUNTER — Other Ambulatory Visit: Payer: Self-pay

## 2020-12-17 VITALS — BP 114/68 | HR 65 | Ht 74.0 in | Wt 205.0 lb

## 2020-12-17 DIAGNOSIS — S6991XD Unspecified injury of right wrist, hand and finger(s), subsequent encounter: Secondary | ICD-10-CM

## 2020-12-17 DIAGNOSIS — Z0000 Encounter for general adult medical examination without abnormal findings: Secondary | ICD-10-CM

## 2020-12-17 LAB — LIPID PANEL
HDL: 40 mg/dL (ref >=40)
LDL Cholesterol (Calc): 99 mg/dL (calc)

## 2020-12-17 LAB — COMPREHENSIVE METABOLIC PANEL
Albumin: 5 g/dL (ref 3.6–5.1)
BUN: 16 mg/dL (ref 7–25)
Creat: 1.04 mg/dL (ref 0.60–1.35)
Total Bilirubin: 0.7 mg/dL (ref 0.2–1.2)

## 2020-12-17 LAB — CBC: MCHC: 34.3 g/dL (ref 32.0–36.0)

## 2020-12-17 NOTE — Assessment & Plan Note (Signed)
Routine annual physical as above, up-to-date on screening measures. Adding routine labs.

## 2020-12-17 NOTE — Progress Notes (Signed)
Subjective:    CC: Annual Physical Exam  HPI:  This patient is here for their annual physical  I reviewed the past medical history, family history, social history, surgical history, and allergies today and no changes were needed.  Please see the problem list section below in epic for further details.  Past Medical History: No past medical history on file. Past Surgical History: No past surgical history on file. Social History: Social History   Socioeconomic History  . Marital status: Married    Spouse name: Not on file  . Number of children: Not on file  . Years of education: Not on file  . Highest education level: Not on file  Occupational History  . Occupation: Healthcare Admin  Tobacco Use  . Smoking status: Never Smoker  . Smokeless tobacco: Never Used  Substance and Sexual Activity  . Alcohol use: Yes    Comment: 2-3 weekly  . Drug use: No  . Sexual activity: Yes  Other Topics Concern  . Not on file  Social History Narrative  . Not on file   Social Determinants of Health   Financial Resource Strain: Not on file  Food Insecurity: Not on file  Transportation Needs: Not on file  Physical Activity: Not on file  Stress: Not on file  Social Connections: Not on file   Family History: Family History  Problem Relation Age of Onset  . Breast cancer Maternal Grandmother   . Cancer Maternal Grandmother   . Diabetes Maternal Grandmother   . Hypertension Maternal Grandmother   . Diabetes Mother   . Hypertension Mother   . Alcohol abuse Father   . Hyperlipidemia Father   . Hypertension Father   . Diabetes Maternal Aunt   . Alcohol abuse Maternal Grandfather   . Hypertension Maternal Grandfather   . Hyperlipidemia Paternal Grandmother   . Hypertension Paternal Grandmother   . Stroke Paternal Grandmother   . Alcohol abuse Paternal Grandfather   . Heart disease Paternal Grandfather   . Hyperlipidemia Paternal Grandfather   . Hypertension Paternal Grandfather     Allergies: No Known Allergies Medications: See med rec.  Review of Systems: No headache, visual changes, nausea, vomiting, diarrhea, constipation, dizziness, abdominal pain, skin rash, fevers, chills, night sweats, swollen lymph nodes, weight loss, chest pain, body aches, joint swelling, muscle aches, shortness of breath, mood changes, visual or auditory hallucinations.  Objective:    General: Well Developed, well nourished, and in no acute distress.  Neuro: Alert and oriented x3, extra-ocular muscles intact, sensation grossly intact. Cranial nerves II through XII are intact, motor, sensory, and coordinative functions are all intact. HEENT: Normocephalic, atraumatic, pupils equal round reactive to light, neck supple, no masses, no lymphadenopathy, thyroid nonpalpable. Oropharynx, nasopharynx, external ear canals are unremarkable. Skin: Warm and dry, no rashes noted.  Cardiac: Regular rate and rhythm, no murmurs rubs or gallops.  Respiratory: Clear to auscultation bilaterally. Not using accessory muscles, speaking in full sentences.  Abdominal: Soft, nontender, nondistended, positive bowel sounds, no masses, no organomegaly.  Musculoskeletal: Shoulder, elbow, wrist, hip, knee, ankle stable, and with full range of motion.  Impression and Recommendations:    The patient was counselled, risk factors were discussed, anticipatory guidance given.  Annual physical exam Routine annual physical as above, up-to-date on screening measures. Adding routine labs.  Injury of right thumb Lateral collateral ligament sprain of the right first metacarpophalangeal joint. Overall improving, ligaments are stable, good motion, we are just going to watch this for now.   ___________________________________________  Gwen Her. Dianah Field, M.D., ABFM., CAQSM. Primary Care and Sports Medicine [facility]  Adjunct Professor of [place] [employer] [facility] of  Medicine

## 2020-12-17 NOTE — Assessment & Plan Note (Signed)
Lateral collateral ligament sprain of the right first metacarpophalangeal joint. Overall improving, ligaments are stable, good motion, we are just going to watch this for now.

## 2020-12-18 LAB — HEMOGLOBIN A1C
Hgb A1c MFr Bld: 5.2 % of total Hgb (ref <5.7)
Mean Plasma Glucose: 103 mg/dL
eAG (mmol/L): 5.7 mmol/L

## 2020-12-18 LAB — COMPREHENSIVE METABOLIC PANEL
ALT: 19 U/L (ref 9–46)
Alkaline phosphatase (APISO): 64 U/L (ref 36–130)
Calcium: 9.6 mg/dL (ref 8.6–10.3)
Chloride: 105 mmol/L (ref 98–110)
Globulin: 2.5 g/dL (calc) (ref 1.9–3.7)
Glucose, Bld: 88 mg/dL (ref 65–99)
Total Protein: 7.5 g/dL (ref 6.1–8.1)

## 2020-12-18 LAB — LIPID PANEL
Cholesterol: 153 mg/dL (ref <200)
Non-HDL Cholesterol (Calc): 113 mg/dL (ref <130)
Total CHOL/HDL Ratio: 3.8 (calc) (ref <5.0)
Triglycerides: 56 mg/dL (ref <150)

## 2020-12-18 LAB — CBC
HCT: 45.8 % (ref 38.5–50.0)
Hemoglobin: 15.7 g/dL (ref 13.2–17.1)
MCH: 30.5 pg (ref 27.0–33.0)
MCV: 88.9 fL (ref 80.0–100.0)
MPV: 11.9 fL (ref 7.5–12.5)
Platelets: 230 10*3/uL (ref 140–400)
RBC: 5.15 Million/uL (ref 4.20–5.80)
RDW: 12.2 % (ref 11.0–15.0)
WBC: 6.7 10*3/uL (ref 3.8–10.8)

## 2020-12-18 LAB — COMPREHENSIVE METABOLIC PANEL WITH GFR
AG Ratio: 2 (calc) (ref 1.0–2.5)
AST: 16 U/L (ref 10–40)
CO2: 28 mmol/L (ref 20–32)
Potassium: 4.5 mmol/L (ref 3.5–5.3)
Sodium: 142 mmol/L (ref 135–146)

## 2020-12-18 LAB — TSH: TSH: 1.95 m[IU]/L (ref 0.40–4.50)

## 2021-02-24 ENCOUNTER — Telehealth: Admitting: Sports Medicine

## 2021-09-21 ENCOUNTER — Ambulatory Visit: Attending: Internal Medicine

## 2021-09-21 ENCOUNTER — Other Ambulatory Visit (HOSPITAL_BASED_OUTPATIENT_CLINIC_OR_DEPARTMENT_OTHER): Payer: Self-pay

## 2021-09-21 DIAGNOSIS — Z23 Encounter for immunization: Secondary | ICD-10-CM

## 2021-09-21 MED ORDER — PFIZER COVID-19 VAC BIVALENT 30 MCG/0.3ML IM SUSP
INTRAMUSCULAR | 0 refills | Status: DC
  Filled 2021-09-21: qty 0.3, 1d supply, fill #0

## 2021-09-21 NOTE — Progress Notes (Signed)
° °  Covid-19 Vaccination Clinic  Name:  Baraa Tubbs    MRN: [id number] DOB: [birth year]  09/21/2021  Mr. Kohlenberg was observed post Covid-19 immunization for 15 minutes without incident. He was provided with Vaccine Information Sheet and instruction to access the V-Safe system.   Mr. Caraveo was instructed to call 911 with any severe reactions post vaccine: Difficulty breathing  Swelling of face and throat  A fast heartbeat  A bad rash all over body  Dizziness and weakness   Immunizations Administered     Name Date Dose VIS Date Route   Pfizer Covid-19 Vaccine Bivalent Booster 09/21/2021 11:35 AM 0.3 mL 05/19/2021 Intramuscular   Manufacturer: [place]   Lot: [id number]   [place]: [phone]

## 2021-12-16 ENCOUNTER — Other Ambulatory Visit (HOSPITAL_COMMUNITY): Payer: Self-pay

## 2022-01-17 ENCOUNTER — Encounter: Payer: Self-pay | Admitting: Sports Medicine

## 2022-01-17 ENCOUNTER — Ambulatory Visit (INDEPENDENT_AMBULATORY_CARE_PROVIDER_SITE_OTHER): Admitting: Sports Medicine

## 2022-01-17 VITALS — BP 107/69 | HR 53 | Ht 74.0 in | Wt 209.0 lb

## 2022-01-17 DIAGNOSIS — Z0000 Encounter for general adult medical examination without abnormal findings: Secondary | ICD-10-CM | POA: Diagnosis not present

## 2022-01-17 DIAGNOSIS — Z23 Encounter for immunization: Secondary | ICD-10-CM | POA: Diagnosis not present

## 2022-01-17 DIAGNOSIS — R739 Hyperglycemia, unspecified: Secondary | ICD-10-CM

## 2022-01-17 NOTE — Addendum Note (Signed)
Addended by: Dema Severin on: 01/17/2022 09:13 AM ? ? Modules accepted: Orders ? ?

## 2022-01-17 NOTE — Assessment & Plan Note (Signed)
Annual physical as above, Tdap today, labs today, return in a year. ?

## 2022-01-17 NOTE — Progress Notes (Signed)
?  Subjective:   ? ?CC: Annual Physical Exam ? ?HPI:  ?This patient is here for their annual physical ? ?I reviewed the past medical history, family history, social history, surgical history, and allergies today and no changes were needed.  Please see the problem list section below in epic for further details. ? ?Past Medical History: ?No past medical history on file. ?Past Surgical History: ?No past surgical history on file. ?Social History: ?Social History  ? ?Socioeconomic History  ? Marital status: Married  ?  Spouse name: Not on file  ? Number of children: Not on file  ? Years of education: Not on file  ? Highest education level: Not on file  ?Occupational History  ? Occupation: Healthcare Admin  ?Tobacco Use  ? Smoking status: Never  ? Smokeless tobacco: Never  ?Substance and Sexual Activity  ? Alcohol use: Yes  ?  Comment: 2-3 weekly  ? Drug use: No  ? Sexual activity: Yes  ?Other Topics Concern  ? Not on file  ?Social History Narrative  ? Not on file  ? ?Social Determinants of Health  ? ?Financial Resource Strain: Not on file  ?Food Insecurity: Not on file  ?Transportation Needs: Not on file  ?Physical Activity: Not on file  ?Stress: Not on file  ?Social Connections: Not on file  ? ?Family History: ?Family History  ?Problem Relation Age of Onset  ? Breast cancer Maternal Grandmother   ? Cancer Maternal Grandmother   ? Diabetes Maternal Grandmother   ? Hypertension Maternal Grandmother   ? Diabetes Mother   ? Hypertension Mother   ? Alcohol abuse Father   ? Hyperlipidemia Father   ? Hypertension Father   ? Diabetes Maternal Aunt   ? Alcohol abuse Maternal Grandfather   ? Hypertension Maternal Grandfather   ? Hyperlipidemia Paternal Grandmother   ? Hypertension Paternal Grandmother   ? Stroke Paternal Grandmother   ? Alcohol abuse Paternal Grandfather   ? Heart disease Paternal Grandfather   ? Hyperlipidemia Paternal Grandfather   ? Hypertension Paternal Grandfather   ? ?Allergies: ?No Known  Allergies ?Medications: See med rec. ? ?Review of Systems: No headache, visual changes, nausea, vomiting, diarrhea, constipation, dizziness, abdominal pain, skin rash, fevers, chills, night sweats, swollen lymph nodes, weight loss, chest pain, body aches, joint swelling, muscle aches, shortness of breath, mood changes, visual or auditory hallucinations. ? ?Objective:   ? ?General: Well Developed, well nourished, and in no acute distress.  ?Neuro: Alert and oriented x3, extra-ocular muscles intact, sensation grossly intact. Cranial nerves II through XII are intact, motor, sensory, and coordinative functions are all intact. ?HEENT: Normocephalic, atraumatic, pupils equal round reactive to light, neck supple, no masses, no lymphadenopathy, thyroid nonpalpable. Oropharynx, nasopharynx, external ear canals are unremarkable. ?Skin: Warm and dry, no rashes noted.  ?Cardiac: Regular rate and rhythm, no murmurs rubs or gallops.  ?Respiratory: Clear to auscultation bilaterally. Not using accessory muscles, speaking in full sentences.  ?Abdominal: Soft, nontender, nondistended, positive bowel sounds, no masses, no organomegaly.  ?Musculoskeletal: Shoulder, elbow, wrist, hip, knee, ankle stable, and with full range of motion. ? ?Impression and Recommendations:   ? ?The patient was counselled, risk factors were discussed, anticipatory guidance given. ? ?Annual physical exam ?Annual physical as above, Tdap today, labs today, return in a year. ? ?___________________________________________ ?Gwen Her. Dianah Field, M.D., ABFM., CAQSM. ?Primary Care and Sports Medicine ?[place] ? ?Adjunct Professor of Family Medicine  ?[employer] ?

## 2022-01-18 LAB — CBC
HCT: 46.8 % (ref 38.5–50.0)
Hemoglobin: 16.1 g/dL (ref 13.2–17.1)
MCH: 30.2 pg (ref 27.0–33.0)
MCHC: 34.4 g/dL (ref 32.0–36.0)
MCV: 87.8 fL (ref 80.0–100.0)
MPV: 11.6 fL (ref 7.5–12.5)
Platelets: 231 10*3/uL (ref 140–400)
RBC: 5.33 10*6/uL (ref 4.20–5.80)
RDW: 12.8 % (ref 11.0–15.0)
WBC: 5.5 10*3/uL (ref 3.8–10.8)

## 2022-01-18 LAB — COMPREHENSIVE METABOLIC PANEL
AG Ratio: 1.8 (calc) (ref 1.0–2.5)
ALT: 19 U/L (ref 9–46)
AST: 17 U/L (ref 10–40)
Albumin: 4.6 g/dL (ref 3.6–5.1)
Alkaline phosphatase (APISO): 63 U/L (ref 36–130)
BUN: 19 mg/dL (ref 7–25)
CO2: 28 mmol/L (ref 20–32)
Calcium: 9.6 mg/dL (ref 8.6–10.3)
Chloride: 106 mmol/L (ref 98–110)
Creat: 1.04 mg/dL (ref 0.60–1.29)
Globulin: 2.6 g/dL (calc) (ref 1.9–3.7)
Glucose, Bld: 95 mg/dL (ref 65–99)
Potassium: 4.5 mmol/L (ref 3.5–5.3)
Sodium: 142 mmol/L (ref 135–146)
Total Bilirubin: 0.8 mg/dL (ref 0.2–1.2)
Total Protein: 7.2 g/dL (ref 6.1–8.1)

## 2022-01-18 LAB — TSH: TSH: 1.5 mIU/L (ref 0.40–4.50)

## 2022-01-18 LAB — LIPID PANEL
Cholesterol: 160 mg/dL (ref <200)
HDL: 40 mg/dL (ref >=40)
LDL Cholesterol (Calc): 106 mg/dL (calc) — ABNORMAL HIGH
Non-HDL Cholesterol (Calc): 120 mg/dL (calc) (ref <130)
Total CHOL/HDL Ratio: 4 (calc) (ref <5.0)
Triglycerides: 58 mg/dL (ref <150)

## 2022-01-18 LAB — HEMOGLOBIN A1C
Hgb A1c MFr Bld: 5.5 % of total Hgb (ref <5.7)
Mean Plasma Glucose: 111 mg/dL
eAG (mmol/L): 6.2 mmol/L

## 2022-10-23 IMAGING — DX DG FINGER THUMB 2+V*R*
3 series · 3 of 3 positions shown · non-contrast
Comparison: None.

CLINICAL DATA: Pain at first MCP joint after basketball injury

EXAM:
RIGHT THUMB 2+V

[finger ap]
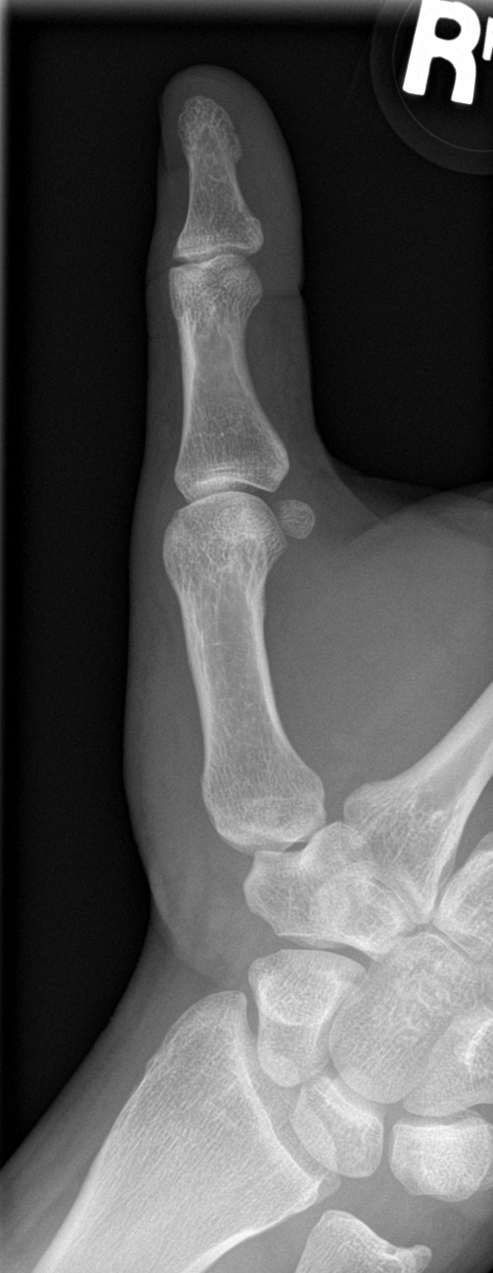

[finger obl]
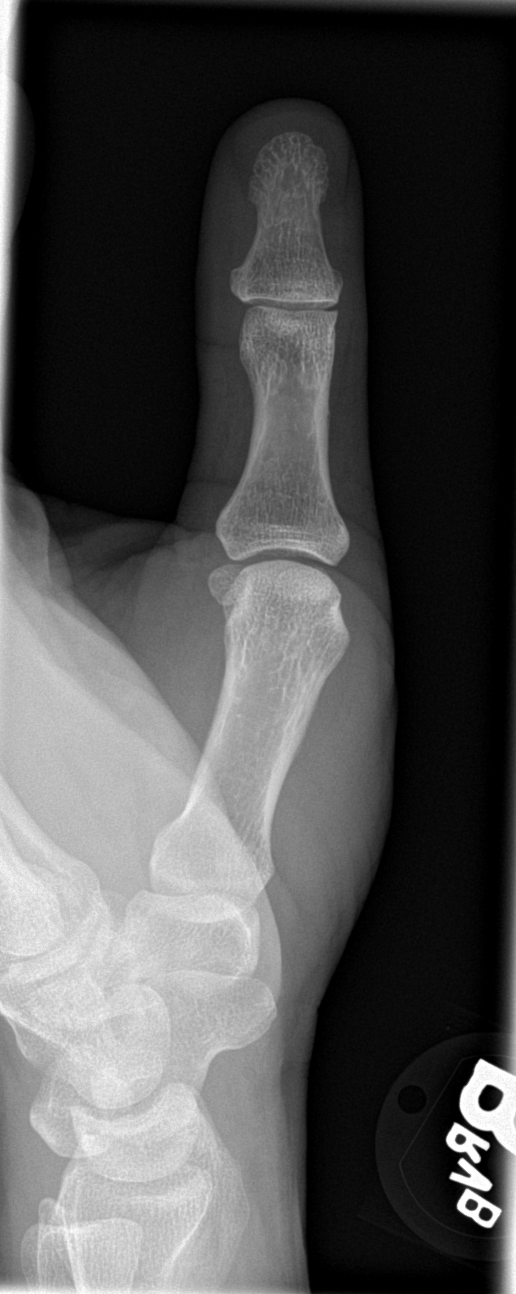

[finger lat]
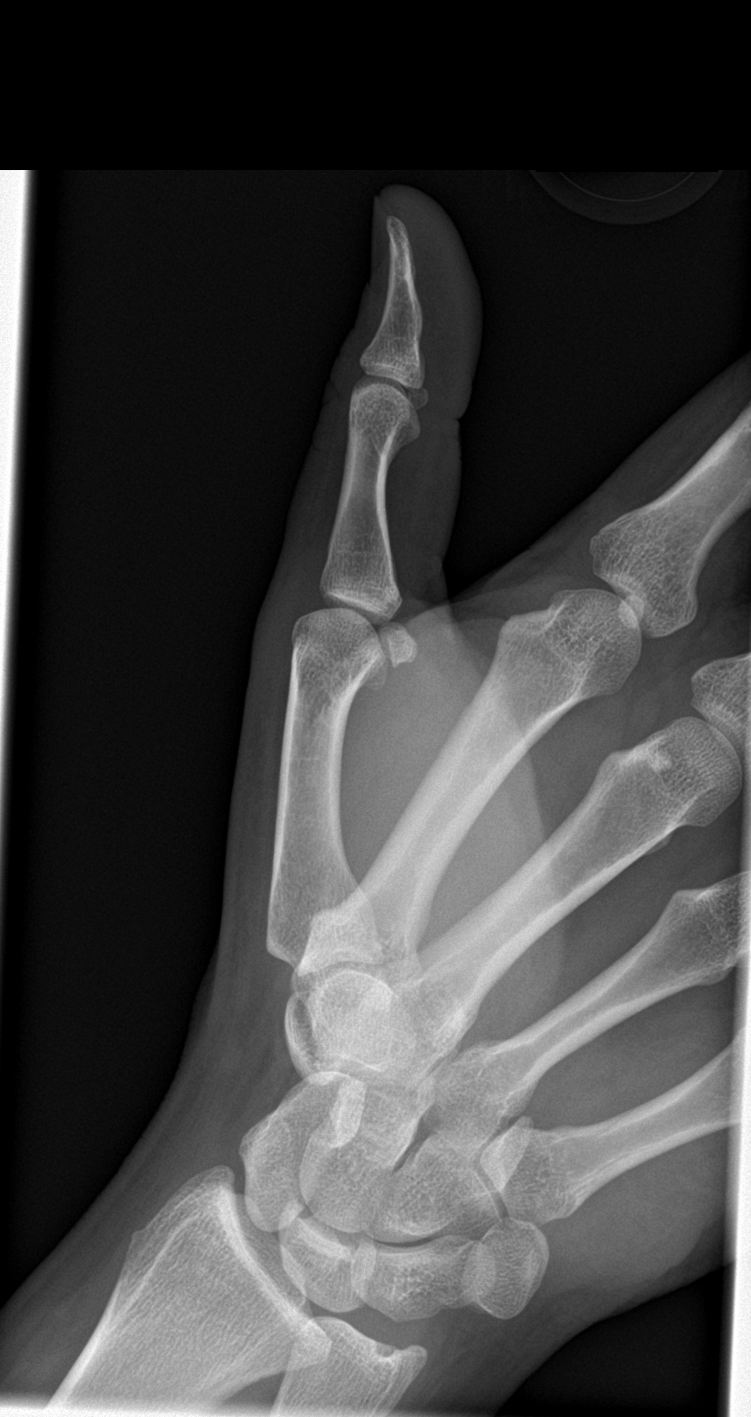

[3 of 3 positions shown; findings below may reference images not displayed]

FINDINGS: There is no evidence of fracture or dislocation. There is no
evidence of arthropathy or other focal bone abnormality. Soft
tissues are unremarkable.
IMPRESSION: Negative.

## 2023-03-08 ENCOUNTER — Encounter: Admitting: Sports Medicine

## 2023-03-29 ENCOUNTER — Encounter: Payer: Self-pay | Admitting: Sports Medicine

## 2023-03-29 ENCOUNTER — Ambulatory Visit (INDEPENDENT_AMBULATORY_CARE_PROVIDER_SITE_OTHER): Admitting: Sports Medicine

## 2023-03-29 VITALS — BP 120/86 | HR 57 | Ht 74.0 in | Wt 218.0 lb

## 2023-03-29 DIAGNOSIS — Z0000 Encounter for general adult medical examination without abnormal findings: Secondary | ICD-10-CM

## 2023-03-29 NOTE — Progress Notes (Signed)
Subjective:    CC: Annual Physical Exam  HPI:  This patient is here for their annual physical  I reviewed Tyrone past medical history, family history, social history, surgical history, and allergies today and no changes were needed.  Please see Tyrone problem list section below in epic for further details.  Past Medical History: No past medical history on file. Past Surgical History: No past surgical history on file. Social History: Social History   Socioeconomic History   Marital status: Married    Spouse name: Not on file   Number of children: Not on file   Years of education: Not on file   Highest education level: Not on file  Occupational History   Occupation: Healthcare Admin  Tobacco Use   Smoking status: Never   Smokeless tobacco: Never  Substance and Sexual Activity   Alcohol use: Yes    Comment: 2-3 weekly   Drug use: No   Sexual activity: Yes  Other Topics Concern   Not on file  Social History Narrative   Not on file   Social Determinants of Health   Financial Resource Strain: Not on file  Food Insecurity: Not on file  Transportation Needs: Not on file  Physical Activity: Not on file  Stress: Not on file  Social Connections: Not on file   Family History: Family History  Problem Relation Age of Onset   Breast cancer Maternal Grandmother    Cancer Maternal Grandmother    Diabetes Maternal Grandmother    Hypertension Maternal Grandmother    Diabetes Mother    Hypertension Mother    Alcohol abuse Father    Hyperlipidemia Father    Hypertension Father    Diabetes Maternal Aunt    Alcohol abuse Maternal Grandfather    Hypertension Maternal Grandfather    Hyperlipidemia Paternal Grandmother    Hypertension Paternal Grandmother    Stroke Paternal Grandmother    Alcohol abuse Paternal Grandfather    Heart disease Paternal Grandfather    Hyperlipidemia Paternal Grandfather    Hypertension Paternal Grandfather    Allergies: No Known  Allergies Medications: See med rec.  Review of Systems: No headache, visual changes, nausea, vomiting, diarrhea, constipation, dizziness, abdominal pain, skin rash, fevers, chills, night sweats, swollen lymph nodes, weight loss, chest pain, body aches, joint swelling, muscle aches, shortness of breath, mood changes, visual or auditory hallucinations.  Objective:    General: Well Developed, well nourished, and in no acute distress.  Neuro: Alert and oriented x3, extra-ocular muscles intact, sensation grossly intact. Cranial nerves II through XII are intact, motor, sensory, and coordinative functions are all intact. HEENT: Normocephalic, atraumatic, pupils equal round reactive to light, neck supple, no masses, no lymphadenopathy, thyroid nonpalpable. Oropharynx, nasopharynx, external ear canals are unremarkable.  Small scab left external ear canal at approximately Tyrone 8 o'clock position Skin: Warm and dry, no rashes noted.  Cardiac: Regular rate and rhythm, no murmurs rubs or gallops.  Respiratory: Clear to auscultation bilaterally. Not using accessory muscles, speaking in full sentences.  Abdominal: Soft, nontender, nondistended, positive bowel sounds, no masses, no organomegaly.  Musculoskeletal: Shoulder, elbow, wrist, hip, knee, ankle stable, and with full range of motion.  Impression and Recommendations:    Tyrone patient was counselled, risk factors were discussed, anticipatory guidance given.  Annual physical exam Annual physical as above, he did have a small scab left external ear canal at approximately Tyrone 8 o'clock position. We will keep an eye on this, he has not had any bleeding, he has  not had any trauma. 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 Otherwise he is up-to-date on screening measures and  we can see him back in a year.   ____________________________________________ Tyrone Simpson. Benjamin Stain, M.D., ABFM., CAQSM., AME. Primary Care and Sports Medicine [facility]  Adjunct Professor of Family Medicine  [employer] [facility] [employer]  Restaurant manager, fast food

## 2023-03-29 NOTE — Assessment & Plan Note (Signed)
Annual physical as above, he did have a small scab left external ear canal at approximately the 8 o'clock position. We will keep an eye on this, he has not had any bleeding, he has not had any trauma. 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 Otherwise he is up-to-date on screening measures and we can see him back in a year.

## 2023-03-30 LAB — CBC
HCT: 46 % (ref 38.5–50.0)
Hemoglobin: 15.8 g/dL (ref 13.2–17.1)
MCH: 30.6 pg (ref 27.0–33.0)
MCHC: 34.3 g/dL (ref 32.0–36.0)
MCV: 89.1 fL (ref 80.0–100.0)
MPV: 10.9 fL (ref 7.5–12.5)
Platelets: 235 10*3/uL (ref 140–400)
RBC: 5.16 10*6/uL (ref 4.20–5.80)
RDW: 12.5 % (ref 11.0–15.0)
WBC: 7.3 10*3/uL (ref 3.8–10.8)

## 2023-03-30 LAB — HEMOGLOBIN A1C
Hgb A1c MFr Bld: 5.6 %{Hb} (ref <5.7)
Mean Plasma Glucose: 114 mg/dL
eAG (mmol/L): 6.3 mmol/L

## 2023-03-30 LAB — COMPREHENSIVE METABOLIC PANEL
AG Ratio: 1.9 (calc) (ref 1.0–2.5)
Alkaline phosphatase (APISO): 55 U/L (ref 36–130)
CO2: 26 mmol/L (ref 20–32)
Chloride: 104 mmol/L (ref 98–110)
Creat: 1.05 mg/dL (ref 0.60–1.29)
Glucose, Bld: 86 mg/dL (ref 65–99)
Potassium: 4.3 mmol/L (ref 3.5–5.3)
Sodium: 140 mmol/L (ref 135–146)
Total Bilirubin: 0.6 mg/dL (ref 0.2–1.2)

## 2023-03-30 LAB — LIPID PANEL
Cholesterol: 192 mg/dL (ref <200)
HDL: 51 mg/dL (ref >=40)
LDL Cholesterol (Calc): 124 mg/dL (calc) — ABNORMAL HIGH
Non-HDL Cholesterol (Calc): 141 mg/dL (calc) — ABNORMAL HIGH (ref <130)
Total CHOL/HDL Ratio: 3.8 (calc) (ref <5.0)
Triglycerides: 78 mg/dL (ref <150)

## 2023-03-30 LAB — COMPREHENSIVE METABOLIC PANEL WITH GFR
ALT: 31 U/L (ref 9–46)
AST: 22 U/L (ref 10–40)
Albumin: 4.7 g/dL (ref 3.6–5.1)
BUN: 16 mg/dL (ref 7–25)
Calcium: 9.6 mg/dL (ref 8.6–10.3)
Globulin: 2.5 g/dL (ref 1.9–3.7)
Total Protein: 7.2 g/dL (ref 6.1–8.1)

## 2023-03-30 LAB — TSH: TSH: 1.93 mIU/L (ref 0.40–4.50)

## 2024-01-17 ENCOUNTER — Ambulatory Visit: Admitting: Sports Medicine

## 2024-01-17 VITALS — BP 99/65 | HR 50 | Wt 208.0 lb

## 2024-01-17 DIAGNOSIS — R739 Hyperglycemia, unspecified: Secondary | ICD-10-CM | POA: Diagnosis not present

## 2024-01-17 DIAGNOSIS — L988 Other specified disorders of the skin and subcutaneous tissue: Secondary | ICD-10-CM | POA: Diagnosis not present

## 2024-01-17 NOTE — Progress Notes (Signed)
    Procedures performed today:    None.  Independent interpretation of notes and tests performed by another provider:   None.  Brief History, Exam, Impression, and Recommendations:    Skin macule Long history of asymptomatic tan macule right flank. No itchiness, no pain, no change. No other similar lesions on his back or chest. For now no treatment needed, we can simply watch it.    ____________________________________________ Joselyn Nicely. Sandy Crumb, M.D., ABFM., CAQSM., AME. Primary Care and Sports Medicine [facility]  Adjunct Professor of [facility] Medicine  [employer]  Restaurant manager, fast food

## 2024-01-17 NOTE — Assessment & Plan Note (Signed)
 Long history of asymptomatic tan macule right flank. No itchiness, no pain, no change. No other similar lesions on his back or chest. For now no treatment needed, we can simply watch it.

## 2024-01-18 LAB — COMPREHENSIVE METABOLIC PANEL WITH GFR
ALT: 19 IU/L (ref 0–44)
AST: 15 IU/L (ref 0–40)
Albumin: 4.7 g/dL (ref 4.1–5.1)
Alkaline Phosphatase: 66 IU/L (ref 44–121)
BUN/Creatinine Ratio: 15 (ref 9–20)
BUN: 16 mg/dL (ref 6–24)
Bilirubin Total: 0.6 mg/dL (ref 0.0–1.2)
CO2: 23 mmol/L (ref 20–29)
Calcium: 9.5 mg/dL (ref 8.7–10.2)
Chloride: 104 mmol/L (ref 96–106)
Creatinine, Ser: 1.08 mg/dL (ref 0.76–1.27)
Globulin, Total: 2.2 g/dL (ref 1.5–4.5)
Glucose: 83 mg/dL (ref 70–99)
Potassium: 4.9 mmol/L (ref 3.5–5.2)
Sodium: 143 mmol/L (ref 134–144)
Total Protein: 6.9 g/dL (ref 6.0–8.5)
eGFR: 87 mL/min/{1.73_m2} (ref >59)

## 2024-01-18 LAB — HEMOGLOBIN A1C
Est. average glucose Bld gHb Est-mCnc: 111 mg/dL
Hgb A1c MFr Bld: 5.5 % (ref 4.8–5.6)

## 2024-01-18 LAB — CBC
Hematocrit: 48.8 % (ref 37.5–51.0)
Hemoglobin: 16.6 g/dL (ref 13.0–17.7)
MCH: 30.4 pg (ref 26.6–33.0)
MCHC: 34 g/dL (ref 31.5–35.7)
MCV: 89 fL (ref 79–97)
Platelets: 251 10*3/uL (ref 150–450)
RBC: 5.46 x10E6/uL (ref 4.14–5.80)
RDW: 12 % (ref 11.6–15.4)
WBC: 6.1 10*3/uL (ref 3.4–10.8)

## 2024-01-18 LAB — LIPID PANEL
Chol/HDL Ratio: 4.5 ratio (ref 0.0–5.0)
Cholesterol, Total: 172 mg/dL (ref 100–199)
HDL: 38 mg/dL — ABNORMAL LOW (ref >39)
LDL Chol Calc (NIH): 117 mg/dL — ABNORMAL HIGH (ref 0–99)
Triglycerides: 91 mg/dL (ref 0–149)
VLDL Cholesterol Cal: 17 mg/dL (ref 5–40)

## 2024-01-18 LAB — TSH: TSH: 2.09 u[IU]/mL (ref 0.450–4.500)

## 2024-05-21 ENCOUNTER — Encounter: Payer: Self-pay | Admitting: Sports Medicine

## 2024-10-20 ENCOUNTER — Emergency Department (HOSPITAL_COMMUNITY)
Admission: EM | Admit: 2024-10-20 | Discharge: 2024-10-20 | Disposition: A | Discharge status: Home / Self Care | Attending: Emergency Medicine | Admitting: Emergency Medicine

## 2024-10-20 ENCOUNTER — Emergency Department (HOSPITAL_COMMUNITY)

## 2024-10-20 ENCOUNTER — Encounter (HOSPITAL_COMMUNITY): Payer: Self-pay

## 2024-10-20 DIAGNOSIS — Y92410 Unspecified street and highway as the place of occurrence of the external cause: Secondary | ICD-10-CM | POA: Insufficient documentation

## 2024-10-20 DIAGNOSIS — S0990XA Unspecified injury of head, initial encounter: Secondary | ICD-10-CM

## 2024-10-20 DIAGNOSIS — S060XAA Concussion with loss of consciousness status unknown, initial encounter: Secondary | ICD-10-CM | POA: Diagnosis not present
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
## 2024-10-22 ENCOUNTER — Ambulatory Visit: Payer: Self-pay | Admitting: *Deleted
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
## 2024-10-22 NOTE — Telephone Encounter (Signed)
 Contacted [facility] at [facility], [phone] at [phone]. They are able to do a hospital f/u for the patient with his [facility] insurance. Tried calling the patient to provide the following information, no answer. Unable to leave a vm msg. Vm is full.

## 2024-10-23 NOTE — Telephone Encounter (Signed)
 Patient was able to schedule the hospital follow up with occupational health for tomorrow 10/24/2024 @ 1:30pm  Scheduled for TOC to [place] , [place] in April 2026.

## 2024-10-30 ENCOUNTER — Inpatient Hospital Stay: Admitting: Physician Assistant

## 2025-01-06 ENCOUNTER — Encounter: Admitting: Urgent Care
